# Patient Record
Sex: Female | Born: 1950 | Race: White | Hispanic: No | Marital: Married | Smoking: Former smoker
Health system: Southern US, Community
[De-identification: ages and names within clinical notes are randomized; demographics above are authoritative.]

## PROBLEM LIST (undated history)

## (undated) DIAGNOSIS — F419 Anxiety disorder, unspecified: Secondary | ICD-10-CM

## (undated) DIAGNOSIS — F32A Depression, unspecified: Secondary | ICD-10-CM

## (undated) DIAGNOSIS — E079 Disorder of thyroid, unspecified: Secondary | ICD-10-CM

## (undated) DIAGNOSIS — E785 Hyperlipidemia, unspecified: Secondary | ICD-10-CM

## (undated) DIAGNOSIS — I2510 Atherosclerotic heart disease of native coronary artery without angina pectoris: Secondary | ICD-10-CM

## (undated) DIAGNOSIS — I10 Essential (primary) hypertension: Secondary | ICD-10-CM

## (undated) HISTORY — DX: Anxiety disorder, unspecified: F41.9

## (undated) HISTORY — DX: Hyperlipidemia, unspecified: E78.5

## (undated) HISTORY — DX: Depression, unspecified: F32.A

## (undated) HISTORY — DX: Essential (primary) hypertension: I10

## (undated) HISTORY — PX: DILATION AND CURETTAGE OF UTERUS: SHX78

## (undated) HISTORY — DX: Disorder of thyroid, unspecified: E07.9

## (undated) HISTORY — DX: Atherosclerotic heart disease of native coronary artery without angina pectoris: I25.10

## (undated) HISTORY — PX: COLONOSCOPY: SHX174

---

## 1998-01-26 ENCOUNTER — Other Ambulatory Visit: Admission: RE | Admit: 1998-01-26 | Discharge: 1998-01-26 | Payer: Self-pay | Admitting: Obstetrics and Gynecology

## 1998-10-07 ENCOUNTER — Emergency Department (HOSPITAL_COMMUNITY): Admission: EM | Admit: 1998-10-07 | Discharge: 1998-10-07 | Payer: Self-pay | Admitting: Emergency Medicine

## 1998-10-08 ENCOUNTER — Encounter (HOSPITAL_COMMUNITY): Admission: RE | Admit: 1998-10-08 | Discharge: 1999-01-06 | Payer: Self-pay

## 2002-03-03 ENCOUNTER — Other Ambulatory Visit: Admission: RE | Admit: 2002-03-03 | Discharge: 2002-03-03 | Payer: Self-pay | Admitting: Obstetrics and Gynecology

## 2003-03-09 ENCOUNTER — Other Ambulatory Visit: Admission: RE | Admit: 2003-03-09 | Discharge: 2003-03-09 | Payer: Self-pay | Admitting: Obstetrics and Gynecology

## 2005-05-07 ENCOUNTER — Other Ambulatory Visit: Admission: RE | Admit: 2005-05-07 | Discharge: 2005-05-07 | Payer: Self-pay | Admitting: Obstetrics and Gynecology

## 2005-05-08 ENCOUNTER — Encounter: Admission: RE | Admit: 2005-05-08 | Discharge: 2005-05-08 | Payer: Self-pay | Admitting: Gastroenterology

## 2005-07-23 ENCOUNTER — Encounter (INDEPENDENT_AMBULATORY_CARE_PROVIDER_SITE_OTHER): Payer: Self-pay | Admitting: Specialist

## 2005-07-23 ENCOUNTER — Ambulatory Visit (HOSPITAL_COMMUNITY): Admission: RE | Admit: 2005-07-23 | Discharge: 2005-07-23 | Payer: Self-pay | Admitting: Obstetrics and Gynecology

## 2007-06-29 ENCOUNTER — Encounter: Admission: RE | Admit: 2007-06-29 | Discharge: 2007-06-29 | Payer: Self-pay | Admitting: Obstetrics and Gynecology

## 2009-03-15 ENCOUNTER — Emergency Department (HOSPITAL_COMMUNITY): Admission: EM | Admit: 2009-03-15 | Discharge: 2009-03-15 | Payer: Self-pay | Admitting: Emergency Medicine

## 2009-03-22 ENCOUNTER — Encounter: Admission: RE | Admit: 2009-03-22 | Discharge: 2009-03-22 | Payer: Self-pay | Admitting: Orthopedic Surgery

## 2009-04-02 ENCOUNTER — Ambulatory Visit (HOSPITAL_COMMUNITY): Admission: RE | Admit: 2009-04-02 | Discharge: 2009-04-02 | Payer: Self-pay | Admitting: Internal Medicine

## 2009-04-03 ENCOUNTER — Encounter: Admission: RE | Admit: 2009-04-03 | Discharge: 2009-04-03 | Payer: Self-pay | Admitting: Internal Medicine

## 2009-04-23 ENCOUNTER — Encounter: Admission: RE | Admit: 2009-04-23 | Discharge: 2009-04-23 | Payer: Self-pay | Admitting: Orthopedic Surgery

## 2009-04-24 ENCOUNTER — Ambulatory Visit (HOSPITAL_COMMUNITY): Admission: RE | Admit: 2009-04-24 | Discharge: 2009-04-24 | Payer: Self-pay | Admitting: Orthopedic Surgery

## 2010-03-06 ENCOUNTER — Emergency Department (HOSPITAL_COMMUNITY): Admission: EM | Admit: 2010-03-06 | Discharge: 2010-03-07 | Payer: Self-pay | Admitting: Emergency Medicine

## 2010-03-07 ENCOUNTER — Inpatient Hospital Stay (HOSPITAL_COMMUNITY): Admission: AD | Admit: 2010-03-07 | Discharge: 2010-03-25 | Payer: Self-pay | Admitting: Psychiatry

## 2010-03-07 ENCOUNTER — Ambulatory Visit: Payer: Self-pay | Admitting: Psychiatry

## 2010-07-21 ENCOUNTER — Encounter: Payer: Self-pay | Admitting: Obstetrics and Gynecology

## 2010-09-12 LAB — RAPID URINE DRUG SCREEN, HOSP PERFORMED
Amphetamines: NOT DETECTED
Barbiturates: NOT DETECTED
Benzodiazepines: NOT DETECTED
Cocaine: NOT DETECTED
Opiates: NOT DETECTED
Tetrahydrocannabinol: NOT DETECTED

## 2010-09-12 LAB — CBC
HCT: 45.4 % (ref 36.0–46.0)
Hemoglobin: 15.8 g/dL — ABNORMAL HIGH (ref 12.0–15.0)
MCH: 33.1 pg (ref 26.0–34.0)
MCHC: 34.8 g/dL (ref 30.0–36.0)
MCV: 95.1 fL (ref 78.0–100.0)
Platelets: 429 10*3/uL — ABNORMAL HIGH (ref 150–400)
RBC: 4.78 MIL/uL (ref 3.87–5.11)
RDW: 12.6 % (ref 11.5–15.5)
WBC: 12.1 10*3/uL — ABNORMAL HIGH (ref 4.0–10.5)

## 2010-09-12 LAB — COMPREHENSIVE METABOLIC PANEL
ALT: 17 U/L (ref 0–35)
AST: 28 U/L (ref 0–37)
Albumin: 4.7 g/dL (ref 3.5–5.2)
Alkaline Phosphatase: 51 U/L (ref 39–117)
BUN: 15 mg/dL (ref 6–23)
CO2: 23 mEq/L (ref 19–32)
Calcium: 9.6 mg/dL (ref 8.4–10.5)
Chloride: 107 mEq/L (ref 96–112)
Creatinine, Ser: 1.01 mg/dL (ref 0.4–1.2)
GFR calc Af Amer: >60 mL/min (ref >60)
GFR calc non Af Amer: 56 mL/min — ABNORMAL LOW (ref >60)
Glucose, Bld: 102 mg/dL — ABNORMAL HIGH (ref 70–99)
Potassium: 4.6 mEq/L (ref 3.5–5.1)
Sodium: 141 mEq/L (ref 135–145)
Total Bilirubin: 1.1 mg/dL (ref 0.3–1.2)
Total Protein: 7.9 g/dL (ref 6.0–8.3)

## 2010-09-12 LAB — DIFFERENTIAL
Basophils Absolute: 0 10*3/uL (ref 0.0–0.1)
Basophils Relative: 0 % (ref 0–1)
Eosinophils Absolute: 0.1 10*3/uL (ref 0.0–0.7)
Eosinophils Relative: 1 % (ref 0–5)
Lymphocytes Relative: 20 % (ref 12–46)
Lymphs Abs: 2.4 10*3/uL (ref 0.7–4.0)
Monocytes Absolute: 0.7 10*3/uL (ref 0.1–1.0)
Monocytes Relative: 6 % (ref 3–12)
Neutro Abs: 8.9 10*3/uL — ABNORMAL HIGH (ref 1.7–7.7)
Neutrophils Relative %: 73 % (ref 43–77)

## 2010-09-12 LAB — T3, FREE: T3, Free: 2.4 pg/mL (ref 2.3–4.2)

## 2010-09-12 LAB — TSH: TSH: 2.413 u[IU]/mL (ref 0.350–4.500)

## 2010-09-12 LAB — TRICYCLICS SCREEN, URINE: TCA Scrn: NOT DETECTED

## 2010-09-12 LAB — ETHANOL: Alcohol, Ethyl (B): <5 mg/dL (ref 0–10)

## 2010-09-12 LAB — T4, FREE: Free T4: 1.28 ng/dL (ref 0.80–1.80)

## 2010-10-03 LAB — BASIC METABOLIC PANEL
BUN: 12 mg/dL (ref 6–23)
CO2: 30 mEq/L (ref 19–32)
Calcium: 8.9 mg/dL (ref 8.4–10.5)
Chloride: 97 mEq/L (ref 96–112)
Creatinine, Ser: 0.87 mg/dL (ref 0.4–1.2)
GFR calc Af Amer: >60 mL/min (ref >60)
GFR calc non Af Amer: >60 mL/min (ref >60)
Glucose, Bld: 75 mg/dL (ref 70–99)
Potassium: 3.6 mEq/L (ref 3.5–5.1)
Sodium: 134 mEq/L — ABNORMAL LOW (ref 135–145)

## 2010-10-03 LAB — ACTH STIMULATION, 3 TIME POINTS
Cortisol, 30 Min: 11.2 ug/dL (ref >20)
Cortisol, 60 Min: 14.3 ug/dL (ref >20)
Cortisol, Base: 3.7 ug/dL

## 2010-11-15 NOTE — Op Note (Signed)
[name], Veronica Wood               ACCOUNT NO.:  [id number]   MEDICAL RECORD NO.:  [id number]          PATIENT TYPE:  AMB   LOCATION:  SDC                           FACILITY:  [facility]   PHYSICIAN:  Juluis Mire, M.D.   DATE OF BIRTH:  05/16/1951   DATE OF PROCEDURE:  07/23/2005  DATE OF DISCHARGE:                                 OPERATIVE REPORT   PREOPERATIVE DIAGNOSIS:  Endometrial polyp.   POSTOPERATIVE DIAGNOSIS:  Endometrial polyp.   OPERATION/PROCEDURE:  1.  Hysteroscopy.  2.  Resection of polyp.  3.  Multiple endometrial biopsies.  4.  Endometrial curettings.   SURGEON:  Juluis Mire, M.D.   ANESTHESIA:  General.   ESTIMATED BLOOD LOSS:  Minimal.   PACKS AND DRAINS:  None.   INTRAOPERATIVE BLOOD REPLACED:  None.   COMPLICATIONS:  None.   INDICATIONS:  Dictated in history and physical.   DESCRIPTION OF PROCEDURE:  The patient was taken to the OR, placed in the  supine position.  After satisfactory level of general anesthesia was  obtained, the patient was placed in the dorsal lithotomy position using the  Allen stirrups.  Perineum and vagina were cleaned out with Betadine.  The  patient draped in a sterile field.  Speculum was placed in the vaginal  vault.  The uterus was sounded to 8 cm.  Cervix was serially dilated to a  size 33 Pratt dilator.  Operative hysteroscope was introduced and  intrauterine cavity was distended using sorbitol.  Visualization was  revealed a polyp-like outgrowth from the left fundal area.  This was  resected along with multiple endometrial biopsies.  Total deficit was 40 mL.  There was no active bleeding or signs of perforation.  We did  endometrial curettings.  Single-tooth tenaculum and speculum removed.  The  patient was taken out of the dorsal lithotomy position and once alert and  extubated was transferred to the recovery room in good condition.  Sponge,  instrument and needle counts reported as correct by the circulating  nurse.      Juluis Mire, M.D.  Electronically Signed     JSM/MEDQ  D:  07/23/2005  T:  07/23/2005  Job:  [id number]

## 2010-11-15 NOTE — H&P (Signed)
Veronica Wood, LUMSDEN               ACCOUNT NO.:  [id number]   MEDICAL RECORD NO.:  [id number]           PATIENT TYPE:   LOCATION:                                FACILITY:  [facility]   PHYSICIAN:  Juluis Mire, M.D.   DATE OF BIRTH:  [birth year]   DATE OF ADMISSION:  07/23/2005  DATE OF DISCHARGE:                                HISTORY & PHYSICAL   Patient is a 60 year old gravida 5, para 0, abortus 5 female who presents  for hysteroscopy.   In relation to the present admission patient has had a history of abnormal  uterine bleeding.  She has had normal FSH levels, therefore is not  menopausal.  Underwent a saline infusion ultrasound, had a prominent  endometrial polyp and small fibroids.  Therefore, proceeds with  hysteroscopic resection of the polyp.   ALLERGIES:  No known drug allergies.   MEDICATIONS:  She is on Prozac and Xanax as needed.   PAST MEDICAL HISTORY:  Significant for history of recurrent pregnancy  losses.  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   PAST SURGICAL HISTORY:  She has had previous diagnostic laparoscopies.  She  has also had five miscarriages.   SOCIAL HISTORY:  There is no tobacco or alcohol use.   FAMILY HISTORY:  Noncontributory.   REVIEW OF SYSTEMS:  Noncontributory.   PHYSICAL EXAMINATION:  VITAL SIGNS:  Patient is afebrile with stable vital  signs.  HEENT:  Patient normocephalic.  Pupils are equal, round, and reactive to  light and accommodation.  Extraocular movements were intact.  Sclerae and  conjunctivae were clear.  Oropharynx clear.  NECK:  Without thyromegaly.  BREASTS:  No discrete masses noted at the present time.  LUNGS:  Clear.  CARDIOVASCULAR:  Regular rhythm and rate without murmurs or gallops.  ABDOMEN:  Benign.  No mass, organomegaly, or tenderness.  PELVIC:  Normal external genitalia.  Vaginal mucosa clear.  Cervix  unremarkable.  Uterus normal size, shape, and contour.  Adnexa free of mass  or  tenderness.  EXTREMITIES:  Trace edema.  NEUROLOGIC:  Grossly within normal limits.   IMPRESSION:  1.  Abnormal bleeding with evidence of endometrial polyp.  2.  Mitral valve prolapse.   PLAN:  Will do SBE prophylaxis.  Proceed with hysteroscopic evaluation and  resection of polyp.  The risks of surgery have been discussed including the  risk of infection, the risk of hemorrhage that could require transfusion  with the risk of AIDS or hepatitis or possible hysterectomy, the risk of  perforation that could lead to injury to adjacent organs requiring  exploratory surgery, risk of deep venous thrombosis and pulmonary embolus.  Patient expressed understanding of indications and risks.      Juluis Mire, M.D.  Electronically Signed     JSM/MEDQ  D:  07/22/2005  T:  07/22/2005  Job:  [id number]

## 2013-07-05 ENCOUNTER — Other Ambulatory Visit: Payer: Self-pay | Admitting: Internal Medicine

## 2013-07-05 DIAGNOSIS — R29898 Other symptoms and signs involving the musculoskeletal system: Secondary | ICD-10-CM

## 2013-07-06 ENCOUNTER — Ambulatory Visit
Admission: RE | Admit: 2013-07-06 | Discharge: 2013-07-06 | Disposition: A | Discharge status: Home / Self Care | Payer: BC Managed Care – PPO | Source: Ambulatory Visit | Attending: Internal Medicine | Admitting: Internal Medicine

## 2013-07-06 DIAGNOSIS — R29898 Other symptoms and signs involving the musculoskeletal system: Secondary | ICD-10-CM

## 2013-07-07 ENCOUNTER — Other Ambulatory Visit: Payer: Self-pay | Admitting: Internal Medicine

## 2013-07-07 DIAGNOSIS — M5412 Radiculopathy, cervical region: Secondary | ICD-10-CM

## 2013-07-09 ENCOUNTER — Ambulatory Visit
Admission: RE | Admit: 2013-07-09 | Discharge: 2013-07-09 | Disposition: A | Discharge status: Home / Self Care | Payer: BC Managed Care – PPO | Source: Ambulatory Visit | Attending: Internal Medicine | Admitting: Internal Medicine

## 2013-07-09 DIAGNOSIS — M5412 Radiculopathy, cervical region: Secondary | ICD-10-CM

## 2015-02-27 ENCOUNTER — Other Ambulatory Visit: Payer: Self-pay | Admitting: Obstetrics and Gynecology

## 2015-03-01 LAB — CYTOLOGY - PAP

## 2016-03-27 ENCOUNTER — Emergency Department (HOSPITAL_COMMUNITY): Payer: BLUE CROSS/BLUE SHIELD

## 2016-03-27 ENCOUNTER — Encounter (HOSPITAL_COMMUNITY): Payer: Self-pay | Admitting: Emergency Medicine

## 2016-03-27 ENCOUNTER — Emergency Department (HOSPITAL_COMMUNITY)
Admission: EM | Admit: 2016-03-27 | Discharge: 2016-03-27 | Disposition: A | Discharge status: Home / Self Care | Payer: BLUE CROSS/BLUE SHIELD | Attending: Emergency Medicine | Admitting: Emergency Medicine

## 2016-03-27 DIAGNOSIS — S40211A Abrasion of right shoulder, initial encounter: Secondary | ICD-10-CM | POA: Insufficient documentation

## 2016-03-27 DIAGNOSIS — Y929 Unspecified place or not applicable: Secondary | ICD-10-CM | POA: Insufficient documentation

## 2016-03-27 DIAGNOSIS — Y939 Activity, unspecified: Secondary | ICD-10-CM | POA: Diagnosis not present

## 2016-03-27 DIAGNOSIS — S0181XA Laceration without foreign body of other part of head, initial encounter: Secondary | ICD-10-CM

## 2016-03-27 DIAGNOSIS — Y999 Unspecified external cause status: Secondary | ICD-10-CM | POA: Diagnosis not present

## 2016-03-27 DIAGNOSIS — S42001A Fracture of unspecified part of right clavicle, initial encounter for closed fracture: Secondary | ICD-10-CM | POA: Diagnosis not present

## 2016-03-27 DIAGNOSIS — S93402A Sprain of unspecified ligament of left ankle, initial encounter: Secondary | ICD-10-CM | POA: Insufficient documentation

## 2016-03-27 DIAGNOSIS — W19XXXA Unspecified fall, initial encounter: Secondary | ICD-10-CM | POA: Diagnosis not present

## 2016-03-27 DIAGNOSIS — S4991XA Unspecified injury of right shoulder and upper arm, initial encounter: Secondary | ICD-10-CM | POA: Diagnosis present

## 2016-03-27 MED ORDER — OXYCODONE-ACETAMINOPHEN 5-325 MG PO TABS: 1.0000 | ORAL_TABLET | ORAL | 0 refills | Status: DC | PRN

## 2016-03-27 MED ORDER — IBUPROFEN 200 MG PO TABS
600.0000 mg | ORAL_TABLET | Freq: Once | ORAL | Status: AC
  Administered 2016-03-27: 600 mg via ORAL
  Filled 2016-03-27: qty 3

## 2016-03-27 MED ORDER — OXYCODONE-ACETAMINOPHEN 5-325 MG PO TABS
2.0000 | ORAL_TABLET | Freq: Once | ORAL | Status: AC
  Administered 2016-03-27: 2 via ORAL
  Filled 2016-03-27: qty 2

## 2016-03-27 MED ORDER — LIDOCAINE-EPINEPHRINE (PF) 1 %-1:200000 IJ SOLN
10.0000 mL | Freq: Once | INTRAMUSCULAR | Status: AC
  Administered 2016-03-27: 10 mL
  Filled 2016-03-27: qty 30

## 2016-03-27 NOTE — ED Provider Notes (Signed)
[facility]-EMERGENCY DEPT Provider Note   CSN: [id number] Arrival date & time: 03/27/16  1126     History   Chief Complaint Chief Complaint  Patient presents with  . Head Injury  . Shoulder Pain  . Ankle Pain    HPI Veronica Wood is a 65 y.o. female.  HPI   65yF presenting after fall. Lost balance and fell. Struck head. No LOC. Facial laceration, L ankle pain and L shoulder pain. Can bear weight. No numbness or tingling. No n/v. No confusion. Broke glasses but denies acute visual changes. No blood thinners. Tetanus UTD.   No past medical history on file.  There are no active problems to display for this patient.   No past surgical history on file.  OB History    No data available       Home Medications    Prior to Admission medications   Medication Sig Start Date End Date Taking? Authorizing Provider  b complex vitamins tablet Take 1 tablet by mouth daily.   Yes Historical Provider, MD  Calcium 250 MG CAPS Take 500 mg by mouth 2 (two) times daily.   Yes Historical Provider, MD  DULoxetine (CYMBALTA) 60 MG capsule Take 60 mg by mouth daily. 01/21/16  Yes Historical Provider, MD  lamoTRIgine (LAMICTAL) 200 MG tablet Take 200 mg by mouth daily. 03/18/16  Yes Historical Provider, MD  levothyroxine (SYNTHROID, LEVOTHROID) 100 MCG tablet Take 50 mcg by mouth every morning.  02/20/16  Yes Historical Provider, MD  MAGNESIUM PO Take 1 tablet by mouth daily.   Yes Historical Provider, MD  Omega-3 Fatty Acids (FISH OIL) 1000 MG CAPS Take 3,000 mg by mouth.   Yes Historical Provider, MD  QUEtiapine (SEROQUEL) 200 MG tablet Take 400-600 mg by mouth at bedtime. 02/20/16  Yes Historical Provider, MD  oxyCODONE-acetaminophen (PERCOCET/ROXICET) 5-325 MG tablet Take 1-2 tablets by mouth every 4 (four) hours as needed for severe pain. 03/27/16   Raeford Razor, MD    Family History No family history on file.  Social History Social History  Substance Use Topics  . Smoking status: Never  Smoker  . Smokeless tobacco: Never Used  . Alcohol use Not on file     Allergies   Celexa [citalopram hydrobromide]   Review of Systems Review of Systems   All systems reviewed and negative, other than as noted in HPI.    Physical Exam Updated Vital Signs BP 183/93 (BP Location: Left Arm)   Pulse 78   Temp 98.4 F (36.9 C)   Resp 18   Ht 5\' 6"  (1.676 m)   Wt 155 lb (70.3 kg)   SpO2 92%   BMI 25.02 kg/m   Physical Exam  Constitutional: She is oriented to person, place, and time. She appears well-developed and well-nourished. No distress.  HENT:  Head: Normocephalic and atraumatic.    Right Ear: External ear normal.  Left Ear: External ear normal.  Mouth/Throat: Oropharynx is clear and moist.  8cm laceration in depicted area. No active bleeding. Metallic FB noted and removed. Mild R periorbital ecchymosis. Minimal bony tenderness. EOM normal.   Eyes: Conjunctivae and EOM are normal. Pupils are equal, round, and reactive to light. Right eye exhibits no discharge. Left eye exhibits no discharge.  Neck: Normal range of motion. Neck supple.  Cardiovascular: Normal rate, regular rhythm and normal heart sounds.  Exam reveals no gallop and no friction rub.   No murmur heard. Pulmonary/Chest: Effort normal and breath sounds normal. No respiratory distress.  Abdominal: Soft. She exhibits no distension. There is no tenderness.  Musculoskeletal: She exhibits no edema or tenderness.  TTP around R AC joint. Abrasion noted, but no open wounds. Significantly limited ROM of shoulder 2/2 pain. Appears to be NVI though. Mild TTP lateral malleolus L ankle. No swelling. NVI. No midline spinal tenderness.   Neurological: She is alert and oriented to person, place, and time. She displays normal reflexes. No cranial nerve deficit. She exhibits normal muscle tone.  Skin: Skin is warm and dry.  Psychiatric: She has a normal mood and affect. Her behavior is normal. Thought content normal.    Nursing note and vitals reviewed.    ED Treatments / Results  Labs (all labs ordered are listed, but only abnormal results are displayed) Labs Reviewed - No data to display  EKG  EKG Interpretation None       Radiology Dg Shoulder Right  Result Date: 03/27/2016 CLINICAL DATA:  Right shoulder pain after a fall today. EXAM: RIGHT SHOULDER - 2+ VIEW COMPARISON:  None. FINDINGS: There is an acute, closed, displaced fracture involving the distal clavicle with slight caudal angulation of the distal fracture fragment and cephalad displacement of the fragment by 1 shaft width. It does not appear to extend into the acromioclavicular joint. The coracoclavicular space does not appear abnormally widened. The glenohumeral joint is maintained. The adjacent visualized ribs and lung are unremarkable. IMPRESSION: Acute, closed, caudal angulated and cephalad displaced distal right clavicular fracture. Electronically Signed   By: Tollie Ethavid  Kwon M.D.   On: 03/27/2016 13:20   Dg Ankle Complete Left  Result Date: 03/27/2016 CLINICAL DATA:  Acute lateral malleolar ankle pain after fall today EXAM: LEFT ANKLE COMPLETE - 3+ VIEW COMPARISON:  None. FINDINGS: There is no evidence of fracture, dislocation, or joint effusion. There is no evidence of arthropathy or other focal bone abnormality. Tiny ossification is seen posterior to the calcaneus along the course of the distal Achilles. The base of fifth metatarsal appears intact. IMPRESSION: Negative for acute fracture or malalignment. Tiny ossific density along the course of the distal Achilles possibly related to old remote trauma or tendinopathy. Electronically Signed   By: Tollie Ethavid  Kwon M.D.   On: 03/27/2016 13:22    Procedures Procedures (including critical care time)  LACERATION REPAIR Performed by: Raeford RazorKOHUT, Livio Ledwith Authorized by: Raeford RazorKOHUT, Thadius Smisek Consent: Verbal consent obtained. Risks and benefits: risks, benefits and alternatives were discussed Consent given  by: patient Patient identity confirmed: provided demographic data Prepped and Draped in normal sterile fashion Wound explored  Laceration Location: face  Laceration Length: 8 cm  Metallic FB located and removed. Appeared to be part of hinge from eye glasses. No additional pieces or other FB noted.   Anesthesia: local infiltration  Local anesthetic: lidocaine 1% w/ epinephrine  Anesthetic total: 4 ml  Irrigation method: syringe Amount of cleaning: standard  Skin closure: 6-0 prolene, 5-0 vicryl rapide  Number of sutures: 8 simple interrupted, 1 running, 1 subcut  Technique: running, simple interupted  Patient tolerance: Patient tolerated the procedure well with no immediate complications.   Medications Ordered in ED Medications  lidocaine-EPINEPHrine (XYLOCAINE-EPINEPHrine) 1 %-1:200000 (PF) injection 10 mL (10 mLs Infiltration Given 03/27/16 1647)  oxyCODONE-acetaminophen (PERCOCET/ROXICET) 5-325 MG per tablet 2 tablet (2 tablets Oral Given 03/27/16 1644)  ibuprofen (ADVIL,MOTRIN) tablet 600 mg (600 mg Oral Given 03/27/16 1644)     Initial Impression / Assessment and Plan / ED Course  I have reviewed the triage vital signs and the nursing notes.  Pertinent labs & imaging results that were available during my care of the patient were reviewed by me and considered in my medical decision making (see chart for details).  Clinical Course    65yF s/p mechanical fall. Closed R clavicle fx. Sling and PRN pain meds. L ankle films without acute osseous abnormality. Did hit head, but neuro exam is nonfocal. No blood thinners. Complex facial laceration. Small metallic FB removed. It appeared to be part of the hinge from her eye glasses. Doubt facial fx. Laceration repaired. Tetanus current. Ortho FU and PCP for suture removal. Return precautions discussed.   Final Clinical Impressions(s) / ED Diagnoses   Final diagnoses:  Clavicle fracture, right, closed, initial encounter  Facial  laceration, initial encounter  Ankle sprain, left, initial encounter    New Prescriptions New Prescriptions   OXYCODONE-ACETAMINOPHEN (PERCOCET/ROXICET) 5-325 MG TABLET    Take 1-2 tablets by mouth every 4 (four) hours as needed for severe pain.     Raeford Razor, MD 04/01/16 1018

## 2016-03-27 NOTE — ED Triage Notes (Addendum)
Pt states she tripped while walking on her sidewalk today. Pt reports right head pain with laceration, left ankle pain, and right shoulder pain. Pt reports hitting her head but denies LOC, neck pain, back pain and dizziness. No blood thinners.

## 2016-03-27 NOTE — ED Notes (Signed)
Suture cart at bedside 

## 2016-11-08 IMAGING — CR DG ANKLE COMPLETE 3+V*L*
3 series · 3 of 3 positions shown · non-contrast
Comparison: None.

CLINICAL DATA: Acute lateral malleolar ankle pain after fall today

EXAM:
LEFT ANKLE COMPLETE - 3+ VIEW

[x ankle ap left]
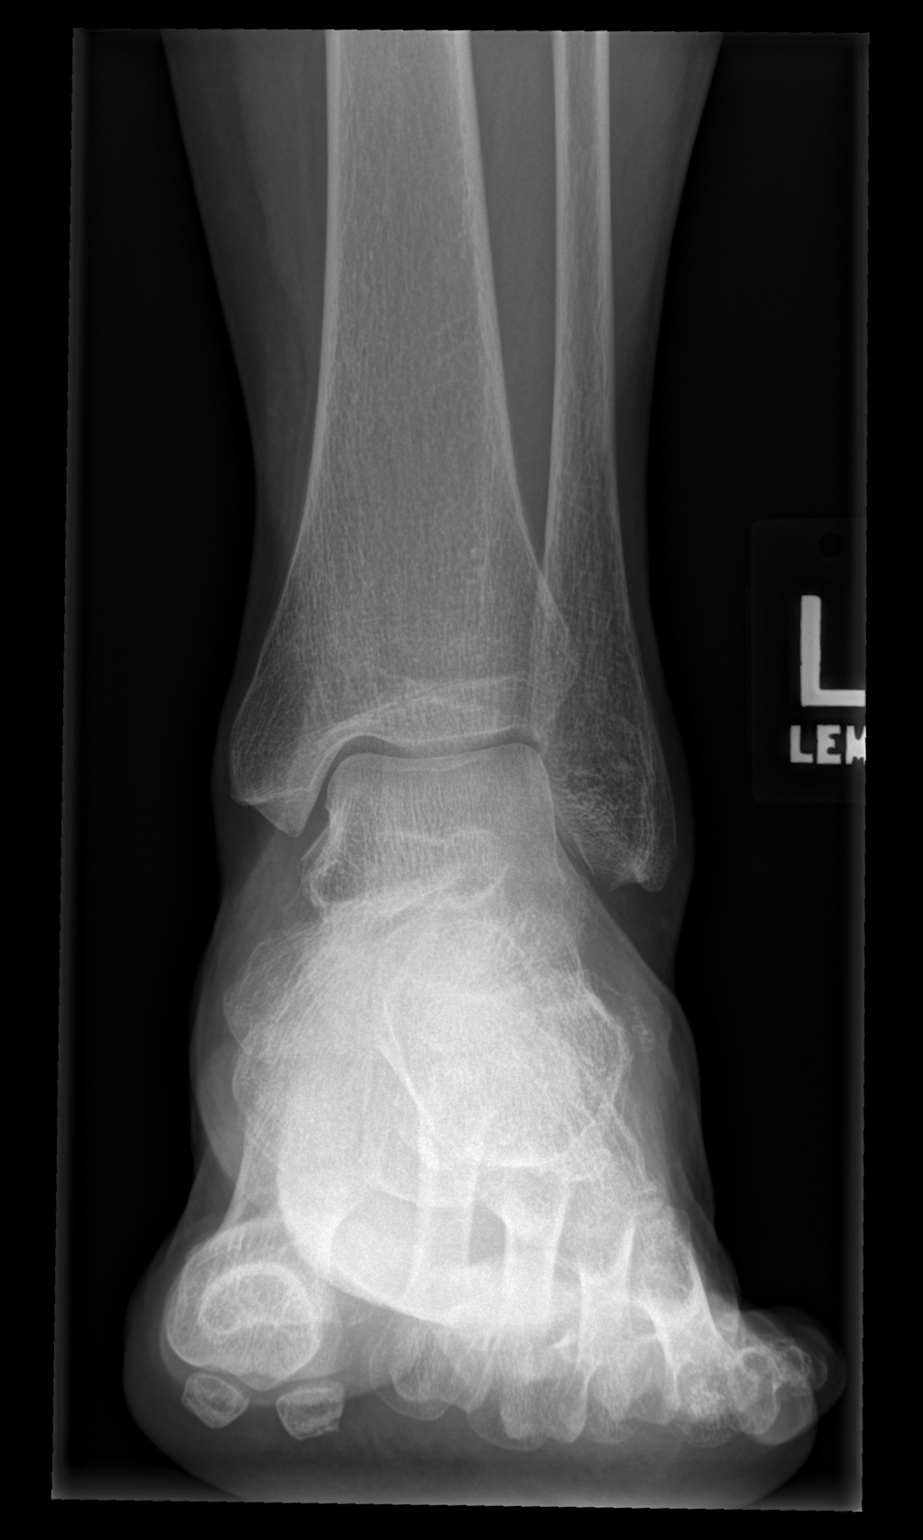

[x ankle obl left]
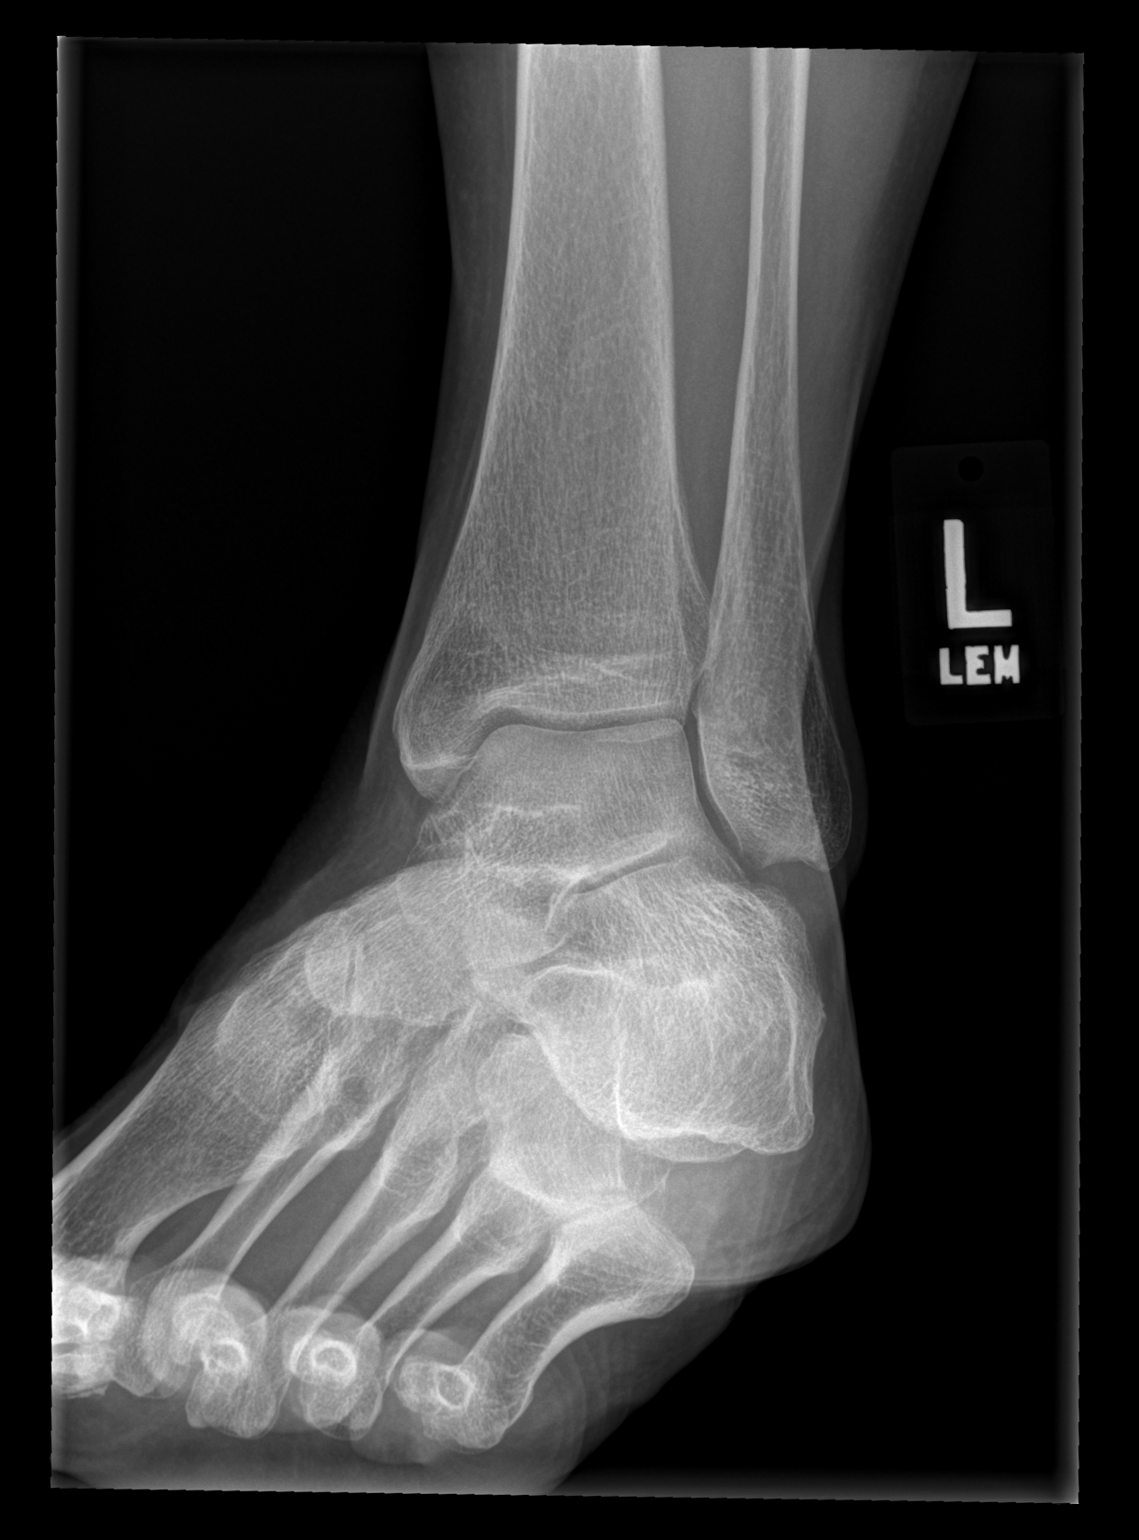

[x ankle lat left]
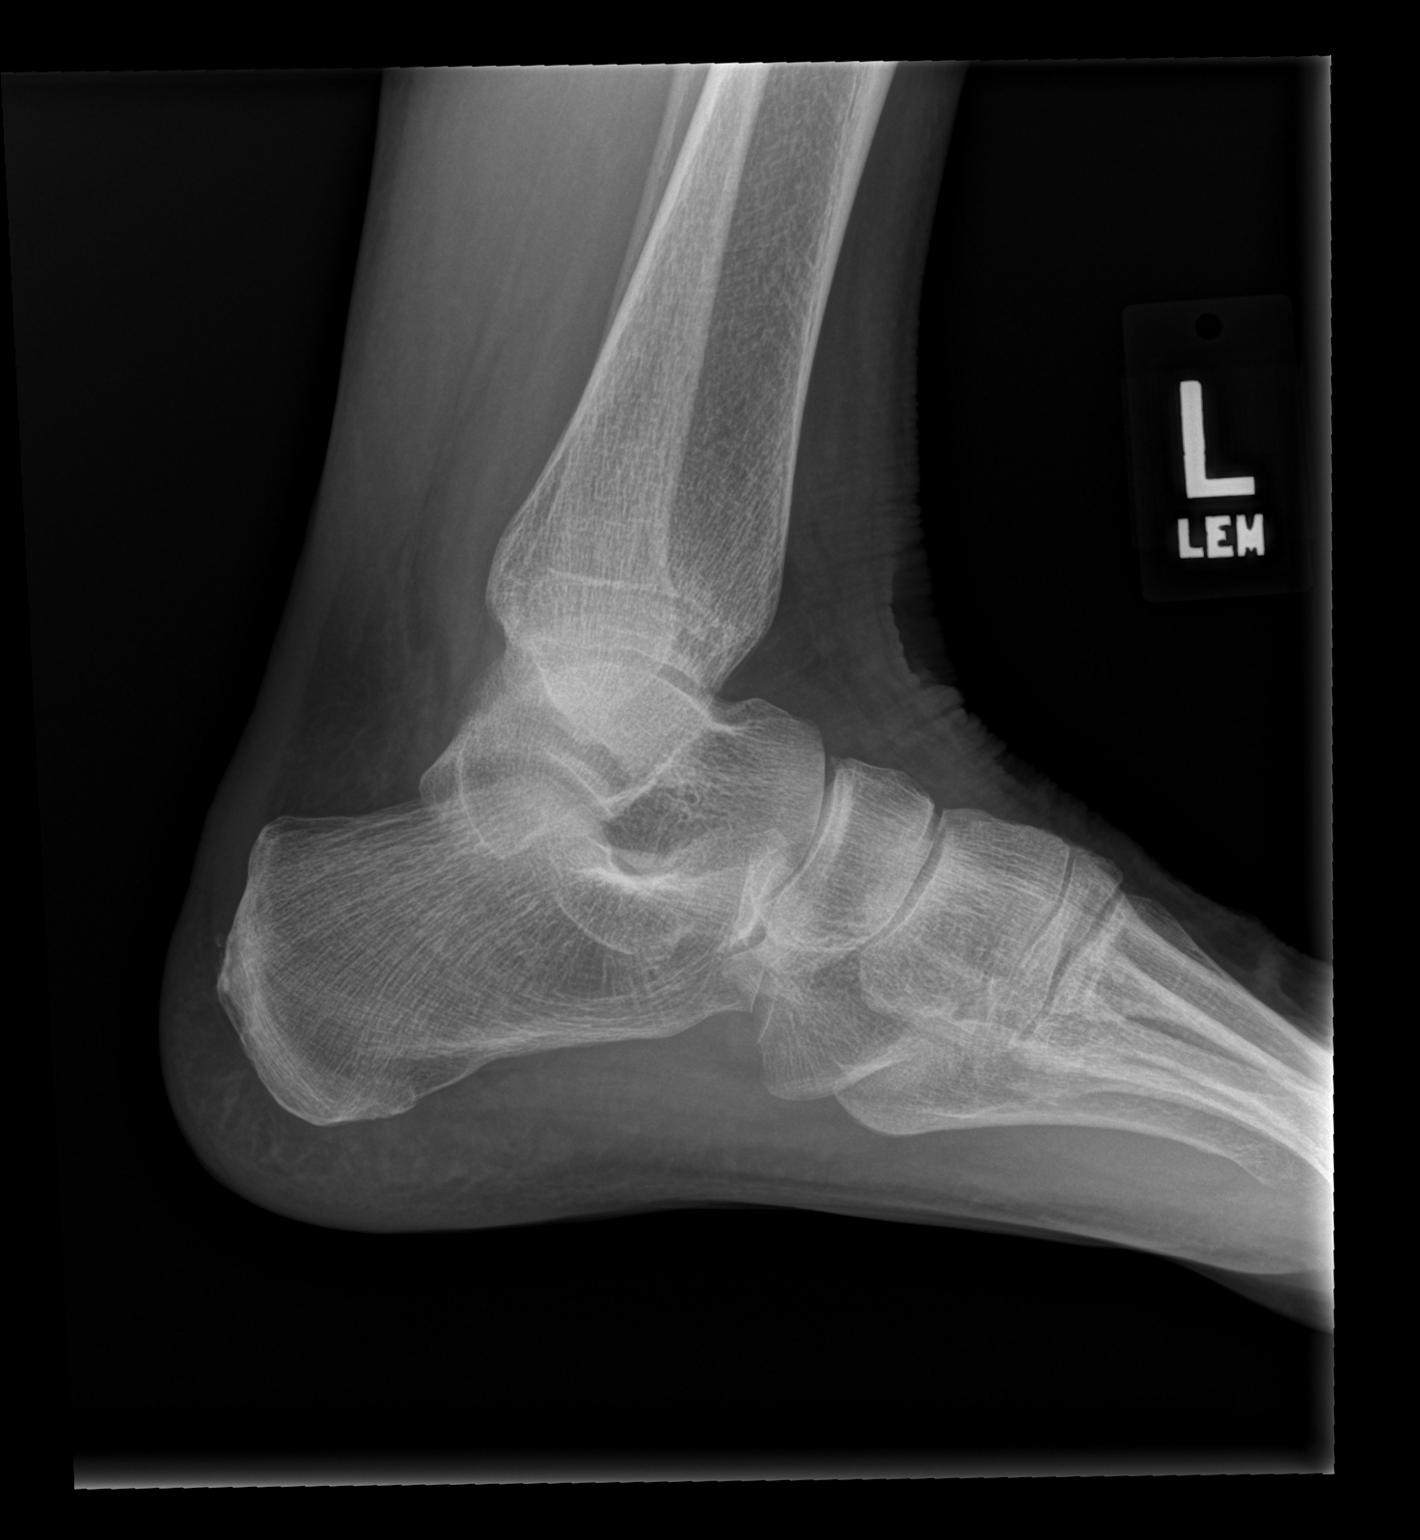

[3 of 3 positions shown; findings below may reference images not displayed]

FINDINGS: There is no evidence of fracture, dislocation, or joint effusion.
There is no evidence of arthropathy or other focal bone abnormality.
Tiny ossification is seen posterior to the calcaneus along the
course of the distal Achilles. The base of fifth metatarsal appears
intact.
IMPRESSION: Negative for acute fracture or malalignment. Tiny ossific density
along the course of the distal Achilles possibly related to old
remote trauma or tendinopathy.

## 2017-04-13 DIAGNOSIS — E559 Vitamin D deficiency, unspecified: Secondary | ICD-10-CM | POA: Diagnosis not present

## 2017-04-13 DIAGNOSIS — D72819 Decreased white blood cell count, unspecified: Secondary | ICD-10-CM | POA: Diagnosis not present

## 2017-04-13 DIAGNOSIS — E7800 Pure hypercholesterolemia, unspecified: Secondary | ICD-10-CM | POA: Diagnosis not present

## 2017-04-13 DIAGNOSIS — E039 Hypothyroidism, unspecified: Secondary | ICD-10-CM | POA: Diagnosis not present

## 2017-04-13 DIAGNOSIS — M8580 Other specified disorders of bone density and structure, unspecified site: Secondary | ICD-10-CM | POA: Diagnosis not present

## 2017-04-16 DIAGNOSIS — Z808 Family history of malignant neoplasm of other organs or systems: Secondary | ICD-10-CM | POA: Diagnosis not present

## 2017-04-16 DIAGNOSIS — M8580 Other specified disorders of bone density and structure, unspecified site: Secondary | ICD-10-CM | POA: Diagnosis not present

## 2017-04-16 DIAGNOSIS — K649 Unspecified hemorrhoids: Secondary | ICD-10-CM | POA: Diagnosis not present

## 2017-04-16 DIAGNOSIS — Z23 Encounter for immunization: Secondary | ICD-10-CM | POA: Diagnosis not present

## 2017-04-16 DIAGNOSIS — K5900 Constipation, unspecified: Secondary | ICD-10-CM | POA: Diagnosis not present

## 2017-04-16 DIAGNOSIS — I341 Nonrheumatic mitral (valve) prolapse: Secondary | ICD-10-CM | POA: Diagnosis not present

## 2017-04-16 DIAGNOSIS — L700 Acne vulgaris: Secondary | ICD-10-CM | POA: Diagnosis not present

## 2017-04-16 DIAGNOSIS — R0982 Postnasal drip: Secondary | ICD-10-CM | POA: Diagnosis not present

## 2017-04-16 DIAGNOSIS — M419 Scoliosis, unspecified: Secondary | ICD-10-CM | POA: Diagnosis not present

## 2017-04-16 DIAGNOSIS — F333 Major depressive disorder, recurrent, severe with psychotic symptoms: Secondary | ICD-10-CM | POA: Diagnosis not present

## 2017-04-16 DIAGNOSIS — E039 Hypothyroidism, unspecified: Secondary | ICD-10-CM | POA: Diagnosis not present

## 2017-04-16 DIAGNOSIS — M180 Bilateral primary osteoarthritis of first carpometacarpal joints: Secondary | ICD-10-CM | POA: Diagnosis not present

## 2017-08-06 DIAGNOSIS — H5203 Hypermetropia, bilateral: Secondary | ICD-10-CM | POA: Diagnosis not present

## 2017-08-06 DIAGNOSIS — H2513 Age-related nuclear cataract, bilateral: Secondary | ICD-10-CM | POA: Diagnosis not present

## 2017-12-17 DIAGNOSIS — Z1231 Encounter for screening mammogram for malignant neoplasm of breast: Secondary | ICD-10-CM | POA: Diagnosis not present

## 2017-12-17 DIAGNOSIS — Z124 Encounter for screening for malignant neoplasm of cervix: Secondary | ICD-10-CM | POA: Diagnosis not present

## 2017-12-17 DIAGNOSIS — Z6824 Body mass index (BMI) 24.0-24.9, adult: Secondary | ICD-10-CM | POA: Diagnosis not present

## 2018-02-22 DIAGNOSIS — Z86010 Personal history of colonic polyps: Secondary | ICD-10-CM | POA: Diagnosis not present

## 2018-05-14 DIAGNOSIS — M79672 Pain in left foot: Secondary | ICD-10-CM | POA: Diagnosis not present

## 2018-05-14 DIAGNOSIS — S92345A Nondisplaced fracture of fourth metatarsal bone, left foot, initial encounter for closed fracture: Secondary | ICD-10-CM | POA: Diagnosis not present

## 2018-05-14 DIAGNOSIS — S92355A Nondisplaced fracture of fifth metatarsal bone, left foot, initial encounter for closed fracture: Secondary | ICD-10-CM | POA: Diagnosis not present

## 2018-05-26 DIAGNOSIS — S92345A Nondisplaced fracture of fourth metatarsal bone, left foot, initial encounter for closed fracture: Secondary | ICD-10-CM | POA: Diagnosis not present

## 2018-05-26 DIAGNOSIS — S92355A Nondisplaced fracture of fifth metatarsal bone, left foot, initial encounter for closed fracture: Secondary | ICD-10-CM | POA: Diagnosis not present

## 2018-05-26 DIAGNOSIS — M79672 Pain in left foot: Secondary | ICD-10-CM | POA: Diagnosis not present

## 2018-06-04 DIAGNOSIS — D72819 Decreased white blood cell count, unspecified: Secondary | ICD-10-CM | POA: Diagnosis not present

## 2018-06-04 DIAGNOSIS — Z1159 Encounter for screening for other viral diseases: Secondary | ICD-10-CM | POA: Diagnosis not present

## 2018-06-04 DIAGNOSIS — E039 Hypothyroidism, unspecified: Secondary | ICD-10-CM | POA: Diagnosis not present

## 2018-06-08 DIAGNOSIS — M8580 Other specified disorders of bone density and structure, unspecified site: Secondary | ICD-10-CM | POA: Diagnosis not present

## 2018-06-08 DIAGNOSIS — F333 Major depressive disorder, recurrent, severe with psychotic symptoms: Secondary | ICD-10-CM | POA: Diagnosis not present

## 2018-06-08 DIAGNOSIS — Z23 Encounter for immunization: Secondary | ICD-10-CM | POA: Diagnosis not present

## 2018-06-08 DIAGNOSIS — Z0000 Encounter for general adult medical examination without abnormal findings: Secondary | ICD-10-CM | POA: Diagnosis not present

## 2018-06-08 DIAGNOSIS — E039 Hypothyroidism, unspecified: Secondary | ICD-10-CM | POA: Diagnosis not present

## 2018-06-08 DIAGNOSIS — E871 Hypo-osmolality and hyponatremia: Secondary | ICD-10-CM | POA: Diagnosis not present

## 2018-06-08 DIAGNOSIS — R251 Tremor, unspecified: Secondary | ICD-10-CM | POA: Diagnosis not present

## 2018-06-17 DIAGNOSIS — S92302A Fracture of unspecified metatarsal bone(s), left foot, initial encounter for closed fracture: Secondary | ICD-10-CM | POA: Diagnosis not present

## 2018-07-06 DIAGNOSIS — R251 Tremor, unspecified: Secondary | ICD-10-CM | POA: Diagnosis not present

## 2018-07-08 DIAGNOSIS — S92302A Fracture of unspecified metatarsal bone(s), left foot, initial encounter for closed fracture: Secondary | ICD-10-CM | POA: Diagnosis not present

## 2018-07-20 DIAGNOSIS — R030 Elevated blood-pressure reading, without diagnosis of hypertension: Secondary | ICD-10-CM | POA: Diagnosis not present

## 2018-07-20 DIAGNOSIS — R251 Tremor, unspecified: Secondary | ICD-10-CM | POA: Diagnosis not present

## 2018-08-06 DIAGNOSIS — H2513 Age-related nuclear cataract, bilateral: Secondary | ICD-10-CM | POA: Diagnosis not present

## 2018-08-10 DIAGNOSIS — R251 Tremor, unspecified: Secondary | ICD-10-CM | POA: Diagnosis not present

## 2018-08-10 DIAGNOSIS — I10 Essential (primary) hypertension: Secondary | ICD-10-CM | POA: Diagnosis not present

## 2018-08-12 DIAGNOSIS — M79672 Pain in left foot: Secondary | ICD-10-CM | POA: Diagnosis not present

## 2018-08-12 DIAGNOSIS — S92302A Fracture of unspecified metatarsal bone(s), left foot, initial encounter for closed fracture: Secondary | ICD-10-CM | POA: Diagnosis not present

## 2018-09-08 DIAGNOSIS — R251 Tremor, unspecified: Secondary | ICD-10-CM | POA: Diagnosis not present

## 2018-09-08 DIAGNOSIS — I10 Essential (primary) hypertension: Secondary | ICD-10-CM | POA: Diagnosis not present

## 2018-12-15 DIAGNOSIS — Z20828 Contact with and (suspected) exposure to other viral communicable diseases: Secondary | ICD-10-CM | POA: Diagnosis not present

## 2019-01-17 DIAGNOSIS — I10 Essential (primary) hypertension: Secondary | ICD-10-CM | POA: Diagnosis not present

## 2019-01-17 DIAGNOSIS — E039 Hypothyroidism, unspecified: Secondary | ICD-10-CM | POA: Diagnosis not present

## 2019-01-28 ENCOUNTER — Other Ambulatory Visit: Payer: Self-pay

## 2019-04-25 DIAGNOSIS — M25511 Pain in right shoulder: Secondary | ICD-10-CM | POA: Diagnosis not present

## 2019-07-14 DIAGNOSIS — Z6825 Body mass index (BMI) 25.0-25.9, adult: Secondary | ICD-10-CM | POA: Diagnosis not present

## 2019-07-14 DIAGNOSIS — Z01419 Encounter for gynecological examination (general) (routine) without abnormal findings: Secondary | ICD-10-CM | POA: Diagnosis not present

## 2019-07-14 DIAGNOSIS — M8588 Other specified disorders of bone density and structure, other site: Secondary | ICD-10-CM | POA: Diagnosis not present

## 2019-07-14 DIAGNOSIS — N958 Other specified menopausal and perimenopausal disorders: Secondary | ICD-10-CM | POA: Diagnosis not present

## 2019-07-14 DIAGNOSIS — Z1231 Encounter for screening mammogram for malignant neoplasm of breast: Secondary | ICD-10-CM | POA: Diagnosis not present

## 2019-07-19 DIAGNOSIS — E871 Hypo-osmolality and hyponatremia: Secondary | ICD-10-CM | POA: Diagnosis not present

## 2019-08-12 DIAGNOSIS — H5203 Hypermetropia, bilateral: Secondary | ICD-10-CM | POA: Diagnosis not present

## 2019-08-12 DIAGNOSIS — H524 Presbyopia: Secondary | ICD-10-CM | POA: Diagnosis not present

## 2019-08-12 DIAGNOSIS — H2513 Age-related nuclear cataract, bilateral: Secondary | ICD-10-CM | POA: Diagnosis not present

## 2020-01-24 DIAGNOSIS — H25811 Combined forms of age-related cataract, right eye: Secondary | ICD-10-CM | POA: Diagnosis not present

## 2020-01-24 DIAGNOSIS — H2511 Age-related nuclear cataract, right eye: Secondary | ICD-10-CM | POA: Diagnosis not present

## 2020-02-28 DIAGNOSIS — H25812 Combined forms of age-related cataract, left eye: Secondary | ICD-10-CM | POA: Diagnosis not present

## 2020-02-28 DIAGNOSIS — H2512 Age-related nuclear cataract, left eye: Secondary | ICD-10-CM | POA: Diagnosis not present

## 2020-03-04 DIAGNOSIS — Z23 Encounter for immunization: Secondary | ICD-10-CM | POA: Diagnosis not present

## 2020-04-23 DIAGNOSIS — Z23 Encounter for immunization: Secondary | ICD-10-CM | POA: Diagnosis not present

## 2020-06-18 DIAGNOSIS — E039 Hypothyroidism, unspecified: Secondary | ICD-10-CM | POA: Diagnosis not present

## 2020-06-18 DIAGNOSIS — M8580 Other specified disorders of bone density and structure, unspecified site: Secondary | ICD-10-CM | POA: Diagnosis not present

## 2020-06-18 DIAGNOSIS — I10 Essential (primary) hypertension: Secondary | ICD-10-CM | POA: Diagnosis not present

## 2020-06-18 DIAGNOSIS — E559 Vitamin D deficiency, unspecified: Secondary | ICD-10-CM | POA: Diagnosis not present

## 2020-06-21 DIAGNOSIS — M180 Bilateral primary osteoarthritis of first carpometacarpal joints: Secondary | ICD-10-CM | POA: Diagnosis not present

## 2020-06-21 DIAGNOSIS — F333 Major depressive disorder, recurrent, severe with psychotic symptoms: Secondary | ICD-10-CM | POA: Diagnosis not present

## 2020-06-21 DIAGNOSIS — E7800 Pure hypercholesterolemia, unspecified: Secondary | ICD-10-CM | POA: Diagnosis not present

## 2020-06-21 DIAGNOSIS — K5900 Constipation, unspecified: Secondary | ICD-10-CM | POA: Diagnosis not present

## 2020-06-21 DIAGNOSIS — Z0000 Encounter for general adult medical examination without abnormal findings: Secondary | ICD-10-CM | POA: Diagnosis not present

## 2020-06-21 DIAGNOSIS — M8580 Other specified disorders of bone density and structure, unspecified site: Secondary | ICD-10-CM | POA: Diagnosis not present

## 2020-06-21 DIAGNOSIS — E039 Hypothyroidism, unspecified: Secondary | ICD-10-CM | POA: Diagnosis not present

## 2020-06-21 DIAGNOSIS — I10 Essential (primary) hypertension: Secondary | ICD-10-CM | POA: Diagnosis not present

## 2020-07-27 ENCOUNTER — Encounter: Payer: Self-pay | Admitting: Cardiology

## 2020-07-27 ENCOUNTER — Ambulatory Visit: Payer: Medicare Other | Admitting: Cardiology

## 2020-07-27 ENCOUNTER — Other Ambulatory Visit: Payer: Self-pay

## 2020-07-27 VITALS — BP 139/84 | HR 70 | Temp 98.2°F | Ht 66.0 in | Wt 165.0 lb

## 2020-07-27 DIAGNOSIS — I10 Essential (primary) hypertension: Secondary | ICD-10-CM

## 2020-07-27 DIAGNOSIS — I2510 Atherosclerotic heart disease of native coronary artery without angina pectoris: Secondary | ICD-10-CM | POA: Diagnosis not present

## 2020-07-27 DIAGNOSIS — Z87891 Personal history of nicotine dependence: Secondary | ICD-10-CM

## 2020-07-27 DIAGNOSIS — E7800 Pure hypercholesterolemia, unspecified: Secondary | ICD-10-CM

## 2020-07-27 DIAGNOSIS — I2584 Coronary atherosclerosis due to calcified coronary lesion: Secondary | ICD-10-CM | POA: Diagnosis not present

## 2020-07-27 MED ORDER — ATORVASTATIN CALCIUM 20 MG PO TABS: 20.0000 mg | ORAL_TABLET | Freq: Every day | ORAL | 0 refills | Status: DC

## 2020-07-27 MED ORDER — ASPIRIN EC 81 MG PO TBEC: 81.0000 mg | DELAYED_RELEASE_TABLET | Freq: Every day | ORAL | 3 refills | Status: AC

## 2020-07-27 NOTE — Progress Notes (Signed)
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 New Patient (Initial Visit)    Coronary artery calcification    HPI  Veronica Wood is a 70 y.o. female who presents to the office with a chief complaint of " coronary calcification." Patient's past medical history and cardiovascular risk factors include: Hypertension, hypercholesterolemia, moderate coronary artery calcification, hypothyroidism, former smoker, advanced age, postmenopausal female.   She is referred to the office at the request of Deland Pretty, MD for evaluation of coronary disease.  Patient states that she recently had a visit with her primary care provider and was noted to have hyperlipidemia.  However, given her LDL being above normal limits this shared decision was to proceed with coronary artery calcium scoring for further stratification.  Patient was found to have moderate coronary artery calcification now referred to cardiology for further evaluation and management.  Clinically she denies any anginal chest pain or heart failure symptoms.   FUNCTIONAL STATUS: No structured exercise program or daily routine.   ALLERGIES: Allergies  Allergen Reactions  . Celexa [Citalopram Hydrobromide] Rash    MEDICATION LIST PRIOR TO VISIT: Current Meds  Medication Sig  . amLODipine (NORVASC) 5 MG tablet Take 5 mg by mouth daily.  Marland Kitchen aspirin EC 81 MG tablet Take 1 tablet (81 mg total) by mouth daily. Swallow whole.  Marland Kitchen atenolol (TENORMIN) 25 MG tablet Take 25 mg by mouth 2 (two) times daily.   Marland Kitchen atorvastatin (LIPITOR) 20 MG tablet Take 1 tablet (20 mg total) by mouth at bedtime.  . Calcium 250 MG CAPS Take 500 mg by mouth 2 (two) times daily.  Marland Kitchen docusate sodium (COLACE) 100 MG capsule 1 capsule as needed  . DULoxetine (CYMBALTA) 60 MG capsule Take 60 mg by mouth daily.  Marland Kitchen lamoTRIgine (LAMICTAL) 200 MG tablet Take 200 mg by mouth daily.  Marland Kitchen levothyroxine (SYNTHROID, LEVOTHROID) 100 MCG tablet Take 50 mcg by mouth every morning.   Marland Kitchen LORazepam (ATIVAN) 0.5 MG tablet Take 0.5 mg by mouth 2 (two) times daily as needed.  Marland Kitchen MAGNESIUM PO Take 1 tablet by mouth daily.  . QUEtiapine (SEROQUEL) 200 MG tablet Take 400-600 mg by mouth at bedtime.     PAST MEDICAL HISTORY: Past Medical History:  Diagnosis Date  . Anxiety   . Coronary atherosclerosis due to calcified coronary lesion   . Depression   . Hyperlipidemia   . Hypertension   . Thyroid disease     PAST SURGICAL HISTORY: Past Surgical History:  Procedure Laterality Date  . COLONOSCOPY    . DILATION AND CURETTAGE OF UTERUS      FAMILY HISTORY: The patient family history includes Pulmonary embolism in her father; Stroke in her mother.  SOCIAL HISTORY:  The patient  reports that she quit smoking about 35 years ago. Her smoking use included cigarettes. She has a 40.00 pack-year smoking history. She has never used smokeless tobacco. She reports current alcohol use of about 1.0 standard drink of alcohol per week. She reports that she does not use drugs.  REVIEW OF SYSTEMS: Review of Systems  Constitutional: Negative for chills and fever.  HENT: Negative for hoarse voice and nosebleeds.   Eyes: Negative for discharge, double vision and pain.  Cardiovascular: Negative for chest pain, claudication, dyspnea on exertion, leg swelling, near-syncope, orthopnea, palpitations, paroxysmal nocturnal dyspnea and syncope.  Respiratory: Negative for hemoptysis and shortness of breath.   Musculoskeletal: Negative for muscle cramps and  myalgias.  Gastrointestinal: Negative for abdominal pain, constipation, diarrhea, hematemesis, hematochezia, melena, nausea and vomiting.  Neurological: Negative for dizziness and light-headedness.    PHYSICAL EXAM: Vitals with BMI 07/27/2020 03/27/2016 03/27/2016  Height 5' 6"  - -  Weight 165 lbs - -  BMI 08.67 - -  Systolic 619 509 326  Diastolic 84 94 93  Pulse 70 72 78   CONSTITUTIONAL: Well-developed and well-nourished. No acute distress.  SKIN: Skin is warm and dry. No rash noted. No cyanosis. No pallor. No jaundice HEAD: Normocephalic and atraumatic.  EYES: No scleral icterus MOUTH/THROAT: Moist oral membranes.  NECK: No JVD present. No thyromegaly noted. No carotid bruits  LYMPHATIC: No visible cervical adenopathy.  CHEST Normal respiratory effort. No intercostal retractions  LUNGS: Clear to auscultation bilaterally.  No stridor. No wheezes. No rales.  CARDIOVASCULAR: Regular rate and rhythm, positive S1-S2, no murmurs rubs or gallops appreciated. ABDOMINAL: Soft, nontender, nondistended, positive bowel sounds all 4 quadrants. No apparent ascites.  EXTREMITIES: No peripheral edema  HEMATOLOGIC: No significant bruising NEUROLOGIC: Oriented to person, place, and time. Nonfocal. Normal muscle tone.  PSYCHIATRIC: Normal mood and affect. Normal behavior. Cooperative  CARDIAC DATABASE: EKG: 07/27/2020: Normal sinus rhythm, 65 bpm, normal axis, consider old anteroseptal infarct, TWI in leads V3-V4, without underlying injury pattern.  Echocardiogram: No results found for this or any previous visit from the past 1095 days.   Coronary artery calcification scoring performed on 07/17/2020 at [facility]: Left main: 0 Left anterior descending:43 Left circumflex:103 Right coronary artery:0 Total calcium score: 152 AU  Stress Testing: No results found for this or any previous visit from the past 1095 days.  Heart Catheterization: None  LABORATORY DATA: CBC Latest Ref Rng &  Units 03/06/2010  WBC 4.0 - 10.5 K/uL 12.1(H)  Hemoglobin 12.0 - 15.0 g/dL 15.8(H)  Hematocrit 36.0 - 46.0 % 45.4  Platelets 150 - 400 K/uL 429(H)    CMP Latest Ref Rng & Units 03/06/2010 04/02/2009  Glucose 70 - 99 mg/dL 102(H) 75  BUN 6 - 23 mg/dL 15 12  Creatinine 0.4 - 1.2 mg/dL 1.01 0.87  Sodium 135 - 145 mEq/L 141 134(L)  Potassium 3.5 - 5.1 mEq/L 4.6 3.6  Chloride 96 - 112 mEq/L 107 97  CO2 19 - 32 mEq/L 23 30  Calcium 8.4 - 10.5 mg/dL 9.6 8.9  Total Protein 6.0 - 8.3 g/dL 7.9 -  Total Bilirubin 0.3 - 1.2 mg/dL 1.1 -  Alkaline Phos 39 - 117 U/L 51 -  AST 0 - 37 U/L 28 -  ALT 0 - 35 U/L 17 -    Lipid Panel  No results found for: CHOL, TRIG, HDL, CHOLHDL, VLDL, LDLCALC, LDLDIRECT, LABVLDL  No components found for: NTPROBNP No results for input(s): PROBNP in the last 8760 hours. No results for input(s): TSH in the last 8760 hours.  BMP No results for input(s): NA, K, CL, CO2, GLUCOSE, BUN, CREATININE, CALCIUM, GFRNONAA, GFRAA in the last 8760 hours.  HEMOGLOBIN A1C No results found for: HGBA1C, MPG   External Labs: Collected: 06/18/2020 Creatinine 0.8 mg/dL. eGFR: >60 mL/min per 1.73 m Hemoglobin 12.3 g/dL, hematocrit 36.8%  Lipid profile: Total cholesterol 290, triglycerides 43, HDL 135, LDL 146, non-HDL155 TSH: 3.09  IMPRESSION:    ICD-10-CM   1. Coronary atherosclerosis due to calcified coronary lesion  I25.10 EKG 12-Lead   I25.84 PCV MYOCARDIAL PERFUSION WO LEXISCAN    PCV ECHOCARDIOGRAM COMPLETE    aspirin EC 81 MG tablet    atorvastatin (LIPITOR) 20 MG tablet    SARS-COV-2 RNA,(COVID-19) QUAL NAAT  2. Benign hypertension  I10   3. Hypercholesterolemia  E78.00   4. Former smoker  Z87.891      RECOMMENDATIONS: Veronica Wood is a 70 y.o. female whose past medical history and cardiac risk factors include: Hypertension, hypercholesterolemia, moderate coronary artery calcification, hypothyroidism, former smoker, advanced age, postmenopausal female.    Despite her excellent HDL levels, patient's LDL is still above the recommended levels.  In addition she has moderate coronary artery calcification based on the most recent coronary calcium scoring report.  Given her estimated 10-year risk of ASCVD is greater than 7.5%.  Therefore recommend aspirin 81 mg p.o. daily and statin therapy.  Patient is agreeable.  We will start Lipitor 20 mg p.o. nightly.  Given her risk factors as discussed above, EKG findings, and coronary artery calcification would recommend stress test to rule out reversible ischemia.  Recommend exercise nuclear stress test to evaluate for functional status, and reversible ischemia.  Covid screen prior to stress testing.  Patient is also asked to hold atenolol 2 days prior to her stress test.  Echocardiogram will be ordered to evaluate for structural heart disease and left ventricular systolic function.  Patient's blood pressure is within acceptable range.  Currently managed by primary care provider.  Educated importance of complete smoking cessation.  FINAL MEDICATION LIST END OF ENCOUNTER: Meds ordered this encounter  Medications  . aspirin EC 81 MG tablet    Sig: Take 1 tablet (81 mg total) by mouth daily. Swallow whole.    Dispense:  90 tablet    Refill:  3  . atorvastatin (LIPITOR) 20 MG tablet    Sig: Take 1 tablet (20 mg total) by mouth at bedtime.    Dispense:  90 tablet    Refill:  0     Current Outpatient Medications:  .  amLODipine (NORVASC) 5 MG tablet, Take 5 mg by mouth daily., Disp: , Rfl:  .  aspirin EC 81 MG tablet, Take 1 tablet (81 mg total) by mouth daily. Swallow whole., Disp: 90 tablet, Rfl: 3 .  atenolol (TENORMIN) 25 MG tablet, Take 25 mg by mouth 2 (two) times daily., Disp: , Rfl:  .  atorvastatin (LIPITOR) 20 MG tablet, Take 1 tablet (20 mg total) by mouth at bedtime., Disp: 90 tablet, Rfl: 0 .  Calcium 250 MG CAPS, Take 500 mg by mouth 2 (two) times daily., Disp: , Rfl:  .  docusate sodium  (COLACE) 100 MG capsule, 1 capsule as needed, Disp: , Rfl:  .  DULoxetine (CYMBALTA) 60 MG capsule, Take 60 mg by mouth daily., Disp: , Rfl: 4 .  lamoTRIgine (LAMICTAL) 200 MG tablet, Take 200 mg by mouth daily., Disp: , Rfl: 8 .  levothyroxine (SYNTHROID, LEVOTHROID) 100 MCG tablet, Take 50 mcg by mouth every morning. , Disp: , Rfl: 0 .  LORazepam (ATIVAN) 0.5 MG tablet, Take 0.5 mg by mouth 2 (two) times daily as needed., Disp: , Rfl:  .  MAGNESIUM PO, Take 1 tablet by mouth daily., Disp: , Rfl:  .  QUEtiapine (SEROQUEL) 200 MG tablet, Take 400-600  mg by mouth at bedtime., Disp: , Rfl: 12  Orders Placed This Encounter  Procedures  . SARS-COV-2 RNA,(COVID-19) QUAL NAAT  . PCV MYOCARDIAL PERFUSION WO LEXISCAN  . EKG 12-Lead  . PCV ECHOCARDIOGRAM COMPLETE    Patient Instructions  Hold Atenolol 2 days before stress test.    --Continue cardiac medications as reconciled in final medication list. --Return in about 4 weeks (around 08/24/2020) for Review test results. Or sooner if needed. --Continue follow-up with your primary care physician regarding the management of your other chronic comorbid conditions.  Patient's questions and concerns were addressed to her satisfaction. She voices understanding of the instructions provided during this encounter.   This note was created using a voice recognition software as a result there may be grammatical errors inadvertently enclosed that do not reflect the nature of this encounter. Every attempt is made to correct such errors.  Rex Kras, [place], [facility]  Pager: [phone] Office: [phone]

## 2020-07-27 NOTE — Patient Instructions (Signed)
Hold Atenolol 2 days before stress test.

## 2020-07-28 DIAGNOSIS — M8580 Other specified disorders of bone density and structure, unspecified site: Secondary | ICD-10-CM | POA: Diagnosis not present

## 2020-07-28 DIAGNOSIS — E039 Hypothyroidism, unspecified: Secondary | ICD-10-CM | POA: Diagnosis not present

## 2020-07-28 DIAGNOSIS — I10 Essential (primary) hypertension: Secondary | ICD-10-CM | POA: Diagnosis not present

## 2020-07-28 DIAGNOSIS — E7800 Pure hypercholesterolemia, unspecified: Secondary | ICD-10-CM | POA: Diagnosis not present

## 2020-08-02 ENCOUNTER — Ambulatory Visit: Payer: Medicare Other

## 2020-08-02 ENCOUNTER — Other Ambulatory Visit: Payer: Self-pay

## 2020-08-02 DIAGNOSIS — I2510 Atherosclerotic heart disease of native coronary artery without angina pectoris: Secondary | ICD-10-CM | POA: Diagnosis not present

## 2020-08-02 DIAGNOSIS — I2584 Coronary atherosclerosis due to calcified coronary lesion: Secondary | ICD-10-CM | POA: Diagnosis not present

## 2020-08-03 ENCOUNTER — Other Ambulatory Visit (HOSPITAL_COMMUNITY)
Admission: RE | Admit: 2020-08-03 | Discharge: 2020-08-03 | Disposition: A | Discharge status: Home / Self Care | Payer: Medicare Other | Source: Ambulatory Visit | Attending: Cardiology | Admitting: Cardiology

## 2020-08-03 DIAGNOSIS — Z20822 Contact with and (suspected) exposure to covid-19: Secondary | ICD-10-CM | POA: Diagnosis not present

## 2020-08-03 LAB — SARS CORONAVIRUS 2 (TAT 6-24 HRS): SARS Coronavirus 2: NEGATIVE

## 2020-08-06 ENCOUNTER — Other Ambulatory Visit: Payer: Self-pay

## 2020-08-06 ENCOUNTER — Ambulatory Visit: Payer: Medicare Other

## 2020-08-06 DIAGNOSIS — I2584 Coronary atherosclerosis due to calcified coronary lesion: Secondary | ICD-10-CM | POA: Diagnosis not present

## 2020-08-06 DIAGNOSIS — I2510 Atherosclerotic heart disease of native coronary artery without angina pectoris: Secondary | ICD-10-CM

## 2020-08-13 NOTE — Progress Notes (Signed)
Called pt to inform her about her stress test. Pt is award

## 2020-08-20 DIAGNOSIS — I2510 Atherosclerotic heart disease of native coronary artery without angina pectoris: Secondary | ICD-10-CM | POA: Diagnosis not present

## 2020-08-20 DIAGNOSIS — I10 Essential (primary) hypertension: Secondary | ICD-10-CM | POA: Diagnosis not present

## 2020-08-20 DIAGNOSIS — J439 Emphysema, unspecified: Secondary | ICD-10-CM | POA: Diagnosis not present

## 2020-08-24 ENCOUNTER — Other Ambulatory Visit: Payer: Self-pay | Admitting: Cardiology

## 2020-08-24 ENCOUNTER — Ambulatory Visit: Payer: Medicare Other | Admitting: Cardiology

## 2020-08-24 ENCOUNTER — Encounter: Payer: Self-pay | Admitting: Cardiology

## 2020-08-24 ENCOUNTER — Other Ambulatory Visit: Payer: Self-pay

## 2020-08-24 VITALS — BP 126/72 | HR 70 | Temp 97.7°F | Resp 16 | Ht 66.0 in | Wt 161.0 lb

## 2020-08-24 DIAGNOSIS — E7800 Pure hypercholesterolemia, unspecified: Secondary | ICD-10-CM | POA: Diagnosis not present

## 2020-08-24 DIAGNOSIS — Z87891 Personal history of nicotine dependence: Secondary | ICD-10-CM | POA: Diagnosis not present

## 2020-08-24 DIAGNOSIS — I2510 Atherosclerotic heart disease of native coronary artery without angina pectoris: Secondary | ICD-10-CM

## 2020-08-24 DIAGNOSIS — I2584 Coronary atherosclerosis due to calcified coronary lesion: Secondary | ICD-10-CM

## 2020-08-24 DIAGNOSIS — I10 Essential (primary) hypertension: Secondary | ICD-10-CM

## 2020-08-24 NOTE — Progress Notes (Signed)
Date:  08/24/2020   ID:  DEEANNA BEIGHTOL, DOB [birth year], MRN [id number]  PCP:  Deland Pretty, MD  Cardiologist:  Rex Kras, DO, [facility] (established care 07/27/2020)  Date: 08/24/20 Last Office Visit: 07/27/2020  Chief Complaint  Patient presents with   Follow-up    4 week   Results    Lab, echo, and stress test    HPI  Veronica Wood is a 70 y.o. female who presents to the office with a chief complaint of " review test results." Patient's past medical history and cardiovascular risk factors include: Hypertension, hypercholesterolemia, moderate coronary artery calcification, hypothyroidism, former smoker, advanced age, postmenopausal female.   She is referred to the office at the request of Deland Pretty, MD for evaluation of coronary disease.  Patient states that she recently had a visit with her primary care provider and was noted to have hyperlipidemia.  However, given her LDL being above normal limits this shared decision was to proceed with coronary artery calcium scoring for further stratification. Patient was found to have moderate coronary artery calcification now referred to cardiology for further evaluation and management.  At the last office visit patient did not have any chest pain or anginal equivalent.  However due to moderate coronary artery calcification the shared decision was to proceed with echocardiogram and stress test for further stratification.  Patient is noted to have a preserved LVEF, normal diastolic filling pattern, and mild mitral regurgitation on the most recent echocardiogram.  And her nuclear stress test notes normal myocardial perfusion overall a low risk study.  Since last office visit she denies any cardiovascular symptoms or hospitalizations.  She was started on Lipitor at the last office visit and has tolerated the medication well without any side effects or intolerances.  FUNCTIONAL STATUS: No structured exercise program or daily routine.    ALLERGIES: Allergies  Allergen Reactions   Celexa [Citalopram Hydrobromide] Rash    MEDICATION LIST PRIOR TO VISIT: Current Meds  Medication Sig   amLODipine (NORVASC) 5 MG tablet Take 5 mg by mouth daily.   aspirin EC 81 MG tablet Take 1 tablet (81 mg total) by mouth daily. Swallow whole.   atenolol (TENORMIN) 25 MG tablet Take 25 mg by mouth 2 (two) times daily.   atorvastatin (LIPITOR) 20 MG tablet Take 1 tablet (20 mg total) by mouth at bedtime.   Calcium 250 MG CAPS Take 500 mg by mouth 2 (two) times daily.   docusate sodium (COLACE) 100 MG capsule 1 capsule as needed   DULoxetine (CYMBALTA) 60 MG capsule Take 60 mg by mouth daily.   lamoTRIgine (LAMICTAL) 200 MG tablet Take 200 mg by mouth daily.   levothyroxine (SYNTHROID, LEVOTHROID) 100 MCG tablet Take 50 mcg by mouth every morning.    LORazepam (ATIVAN) 0.5 MG tablet Take 0.5 mg by mouth 2 (two) times daily as needed.   MAGNESIUM PO Take 1 tablet by mouth daily.   QUEtiapine (SEROQUEL) 200 MG tablet Take 400-600 mg by mouth at bedtime.     PAST MEDICAL HISTORY: Past Medical History:  Diagnosis Date   Anxiety    Coronary atherosclerosis due to calcified coronary lesion    Depression    Hyperlipidemia    Hypertension    Thyroid disease     PAST SURGICAL HISTORY: Past Surgical History:  Procedure Laterality Date   COLONOSCOPY     DILATION AND CURETTAGE OF UTERUS      FAMILY HISTORY: The patient family history includes Pulmonary embolism in  her father; Stroke in her mother.  SOCIAL HISTORY:  The patient  reports that she quit smoking about 35 years ago. Her smoking use included cigarettes. She has a 40.00 pack-year smoking history. She has never used smokeless tobacco. She reports current alcohol use of about 1.0 standard drink of alcohol per week. She reports that she does not use drugs.  REVIEW OF SYSTEMS: Review of Systems  Constitutional: Negative for chills and fever.  HENT:  Negative for hoarse voice and nosebleeds.   Eyes: Negative for discharge, double vision and pain.  Cardiovascular: Negative for chest pain, claudication, dyspnea on exertion, leg swelling, near-syncope, orthopnea, palpitations, paroxysmal nocturnal dyspnea and syncope.  Respiratory: Negative for hemoptysis and shortness of breath.   Musculoskeletal: Negative for muscle cramps and myalgias.  Gastrointestinal: Negative for abdominal pain, constipation, diarrhea, hematemesis, hematochezia, melena, nausea and vomiting.  Neurological: Negative for dizziness and light-headedness.    PHYSICAL EXAM: Vitals with BMI 08/24/2020 07/27/2020 03/27/2016  Height 5' 6"  5' 6"  -  Weight 161 lbs 165 lbs -  BMI 26 48.18 -  Systolic 563 149 702  Diastolic 72 84 94  Pulse 70 70 72   CONSTITUTIONAL: Well-developed and well-nourished. No acute distress.  SKIN: Skin is warm and dry. No rash noted. No cyanosis. No pallor. No jaundice HEAD: Normocephalic and atraumatic.  EYES: No scleral icterus MOUTH/THROAT: Moist oral membranes.  NECK: No JVD present. No thyromegaly noted. No carotid bruits  LYMPHATIC: No visible cervical adenopathy.  CHEST Normal respiratory effort. No intercostal retractions  LUNGS: Clear to auscultation bilaterally.  No stridor. No wheezes. No rales.  CARDIOVASCULAR: Regular rate and rhythm, positive S1-S2, no murmurs rubs or gallops appreciated. ABDOMINAL: Soft, nontender, nondistended, positive bowel sounds all 4 quadrants. No apparent ascites.  EXTREMITIES: No peripheral edema  HEMATOLOGIC: No significant bruising NEUROLOGIC: Oriented to person, place, and time. Nonfocal. Normal muscle tone.  PSYCHIATRIC: Normal mood and affect. Normal behavior. Cooperative  CARDIAC DATABASE: EKG: 07/27/2020: Normal sinus rhythm, 65 bpm, normal axis, consider old anteroseptal infarct, TWI in leads V3-V4, without underlying injury pattern.  Echocardiogram: 08/02/2020:  Normal LV systolic function  with visual EF 55-60%. Left ventricle cavity  is normal in size. Normal global wall motion. Normal diastolic filling  pattern, normal LAP.  Mild (Grade I) mitral regurgitation.  No other significant valvular abnormalities.  No prior study for comparison.   Coronary artery calcification scoring performed on 07/17/2020 at [facility]: Left main: 0 Left anterior descending:43 Left circumflex:103 Right coronary artery:0 Total calcium score: 152 AU  Stress Testing: Lexiscan/modified Bruce Tetrofosmin stress test 08/06/2020: Lexiscan/modified Bruce nuclear stress test performed using 1-day protocol. patient reached 64% of MPHR and achieved 1 MET.  Stress EKG is non-diagnostic, as this is pharmacological stress test. In addition, stress EKG showed sinus rhythm, nonspecific ST-T changes inferior leads.  Normal myocardial perfusion. Stress LVEF 73%. Low risk study.   Heart Catheterization: None  LABORATORY DATA: CBC Latest Ref Rng & Units 03/06/2010  WBC 4.0 - 10.5 K/uL 12.1(H)  Hemoglobin 12.0 - 15.0 g/dL 15.8(H)  Hematocrit 36.0 - 46.0 % 45.4  Platelets 150 - 400 K/uL 429(H)    CMP Latest Ref Rng & Units 03/06/2010 04/02/2009  Glucose 70 - 99 mg/dL 102(H) 75  BUN 6 - 23 mg/dL 15 12  Creatinine 0.4 - 1.2 mg/dL 1.01 0.87  Sodium 135 - 145 mEq/L 141 134(L)  Potassium 3.5 - 5.1 mEq/L 4.6 3.6  Chloride 96 - 112 mEq/L 107 97  CO2 19 -  32 mEq/L 23 30  Calcium 8.4 - 10.5 mg/dL 9.6 8.9  Total Protein 6.0 - 8.3 g/dL 7.9 -  Total Bilirubin 0.3 - 1.2 mg/dL 1.1 -  Alkaline Phos 39 - 117 U/L 51 -  AST 0 - 37 U/L 28 -  ALT 0 - 35 U/L 17 -    Lipid Panel  No results found for: CHOL, TRIG, HDL, CHOLHDL, VLDL, LDLCALC, LDLDIRECT, LABVLDL  No components found for: NTPROBNP No results for input(s): PROBNP in the last 8760 hours. No results for input(s): TSH in the last 8760 hours.  BMP No results for input(s): NA, K, CL, CO2, GLUCOSE, BUN, CREATININE, CALCIUM, GFRNONAA, GFRAA in the last 8760  hours.  HEMOGLOBIN A1C No results found for: HGBA1C, MPG   External Labs: Collected: 06/18/2020 Creatinine 0.8 mg/dL. eGFR: >60 mL/min per 1.73 m Hemoglobin 12.3 g/dL, hematocrit 36.8% Lipid profile: Total cholesterol 290, triglycerides 43, HDL 135, LDL 146, non-HDL155 TSH: 3.09  IMPRESSION:    ICD-10-CM   1. Coronary atherosclerosis due to calcified coronary lesion  I25.10 Comp. Metabolic Panel (12)   N23.55 Lipid Panel With LDL/HDL Ratio    LDL cholesterol, direct  2. Benign hypertension  I10   3. Hypercholesterolemia  E78.00 Comp. Metabolic Panel (12)    Lipid Panel With LDL/HDL Ratio    LDL cholesterol, direct  4. Former smoker  Z87.891      RECOMMENDATIONS: MYKALAH SAARI is a 70 y.o. female whose past medical history and cardiac risk factors include: Hypertension, hypercholesterolemia, moderate coronary artery calcification, hypothyroidism, former smoker, advanced age, postmenopausal female.   At last office visit the shared decision was to initiate statin therapy due to her moderate coronary artery calcification and her estimated 10-year risk of ASCVD greater than 7.5%.  She is tolerated Lipitor 20 mg p.o. nightly without any significant side effects or intolerances.  She is also on aspirin 81 mg p.o. daily.  Given the coronary artery calcification she underwent an ischemic evaluation to rule out reversible ischemia.  Her nuclear stress test was noted to be low risk.  Recommend rechecking her liver function test and a fasting lipid profile in 6 weeks.  Labs ordered.  Patient states that she has an upcoming appointment with her PCP and she may have the same testing done prior to her visit.  I informed her not to do double lab work and her PCP labs may suffice.  If she gets her blood work done prior to her PCP I have asked her to send me a copy for review.  Educated on the importance of improving her modifiable cardiovascular risk factors.   Benign essential hypertension:  Patient's blood pressure is within acceptable range.  Currently managed by primary care provider.  Former smoker: Educated importance of complete smoking cessation.  Recommend 45-monthfollow-up to reevaluate medical therapy and thereafter yearly follow-up or sooner if change in medical condition  FINAL MEDICATION LIST END OF ENCOUNTER: No orders of the defined types were placed in this encounter.    Current Outpatient Medications:    amLODipine (NORVASC) 5 MG tablet, Take 5 mg by mouth daily., Disp: , Rfl:    aspirin EC 81 MG tablet, Take 1 tablet (81 mg total) by mouth daily. Swallow whole., Disp: 90 tablet, Rfl: 3   atenolol (TENORMIN) 25 MG tablet, Take 25 mg by mouth 2 (two) times daily., Disp: , Rfl:    atorvastatin (LIPITOR) 20 MG tablet, Take 1 tablet (20 mg total) by mouth at bedtime., Disp:  90 tablet, Rfl: 0   Calcium 250 MG CAPS, Take 500 mg by mouth 2 (two) times daily., Disp: , Rfl:    docusate sodium (COLACE) 100 MG capsule, 1 capsule as needed, Disp: , Rfl:    DULoxetine (CYMBALTA) 60 MG capsule, Take 60 mg by mouth daily., Disp: , Rfl: 4   lamoTRIgine (LAMICTAL) 200 MG tablet, Take 200 mg by mouth daily., Disp: , Rfl: 8   levothyroxine (SYNTHROID, LEVOTHROID) 100 MCG tablet, Take 50 mcg by mouth every morning. , Disp: , Rfl: 0   LORazepam (ATIVAN) 0.5 MG tablet, Take 0.5 mg by mouth 2 (two) times daily as needed., Disp: , Rfl:    MAGNESIUM PO, Take 1 tablet by mouth daily., Disp: , Rfl:    QUEtiapine (SEROQUEL) 200 MG tablet, Take 400-600 mg by mouth at bedtime., Disp: , Rfl: 12   losartan (COZAAR) 25 MG tablet, Take 25 mg by mouth daily., Disp: , Rfl:   Orders Placed This Encounter  Procedures   Comp. Metabolic Panel (12)   Lipid Panel With LDL/HDL Ratio   LDL cholesterol, direct    There are no Patient Instructions on file for this visit.  --Continue cardiac medications as reconciled in final medication list. --Return in about 6 months (around  02/21/2021) for Follow up, Coronary artery calcification. Or sooner if needed. --Continue follow-up with your primary care physician regarding the management of your other chronic comorbid conditions.  Patient's questions and concerns were addressed to her satisfaction. She voices understanding of the instructions provided during this encounter.   This note was created using a voice recognition software as a result there may be grammatical errors inadvertently enclosed that do not reflect the nature of this encounter. Every attempt is made to correct such errors.  Total time spent: 33 minutes.  Rex Kras, [place], [facility]  Pager: [phone] Office: [phone]

## 2020-08-27 DIAGNOSIS — E7800 Pure hypercholesterolemia, unspecified: Secondary | ICD-10-CM | POA: Diagnosis not present

## 2020-08-27 DIAGNOSIS — E039 Hypothyroidism, unspecified: Secondary | ICD-10-CM | POA: Diagnosis not present

## 2020-08-27 DIAGNOSIS — I10 Essential (primary) hypertension: Secondary | ICD-10-CM | POA: Diagnosis not present

## 2020-08-28 DIAGNOSIS — I2584 Coronary atherosclerosis due to calcified coronary lesion: Secondary | ICD-10-CM | POA: Diagnosis not present

## 2020-08-28 DIAGNOSIS — E7800 Pure hypercholesterolemia, unspecified: Secondary | ICD-10-CM | POA: Diagnosis not present

## 2020-08-28 DIAGNOSIS — I2510 Atherosclerotic heart disease of native coronary artery without angina pectoris: Secondary | ICD-10-CM | POA: Diagnosis not present

## 2020-08-29 LAB — LDL CHOLESTEROL, DIRECT: LDL Direct: 72 mg/dL (ref 0–99)

## 2020-08-29 LAB — COMP. METABOLIC PANEL (12)
AST: 34 IU/L (ref 0–40)
Albumin/Globulin Ratio: 2 (ref 1.2–2.2)
Albumin: 4.9 g/dL — ABNORMAL HIGH (ref 3.8–4.8)
Alkaline Phosphatase: 93 IU/L (ref 44–121)
BUN/Creatinine Ratio: 11 — ABNORMAL LOW (ref 12–28)
BUN: 8 mg/dL (ref 8–27)
Bilirubin Total: 0.4 mg/dL (ref 0.0–1.2)
Calcium: 9.9 mg/dL (ref 8.7–10.3)
Chloride: 98 mmol/L (ref 96–106)
Creatinine, Ser: 0.76 mg/dL (ref 0.57–1.00)
Globulin, Total: 2.5 g/dL (ref 1.5–4.5)
Glucose: 95 mg/dL (ref 65–99)
Potassium: 4.7 mmol/L (ref 3.5–5.2)
Sodium: 136 mmol/L (ref 134–144)
Total Protein: 7.4 g/dL (ref 6.0–8.5)
eGFR: 85 mL/min/{1.73_m2} (ref >59)

## 2020-08-29 LAB — LIPID PANEL WITH LDL/HDL RATIO
Cholesterol, Total: 217 mg/dL — ABNORMAL HIGH (ref 100–199)
HDL: 115 mg/dL (ref >39)
LDL Chol Calc (NIH): 92 mg/dL (ref 0–99)
LDL/HDL Ratio: 0.8 ratio (ref 0.0–3.2)
Triglycerides: 57 mg/dL (ref 0–149)
VLDL Cholesterol Cal: 10 mg/dL (ref 5–40)

## 2020-08-29 NOTE — Progress Notes (Signed)
Patient is aware 

## 2020-08-30 DIAGNOSIS — I2510 Atherosclerotic heart disease of native coronary artery without angina pectoris: Secondary | ICD-10-CM | POA: Diagnosis not present

## 2020-08-31 ENCOUNTER — Other Ambulatory Visit (HOSPITAL_COMMUNITY): Payer: BLUE CROSS/BLUE SHIELD

## 2020-09-24 DIAGNOSIS — Z23 Encounter for immunization: Secondary | ICD-10-CM | POA: Diagnosis not present

## 2020-09-27 DIAGNOSIS — E039 Hypothyroidism, unspecified: Secondary | ICD-10-CM | POA: Diagnosis not present

## 2020-09-27 DIAGNOSIS — E7800 Pure hypercholesterolemia, unspecified: Secondary | ICD-10-CM | POA: Diagnosis not present

## 2020-09-27 DIAGNOSIS — I10 Essential (primary) hypertension: Secondary | ICD-10-CM | POA: Diagnosis not present

## 2020-10-03 ENCOUNTER — Other Ambulatory Visit: Payer: Self-pay | Admitting: Cardiology

## 2020-10-03 DIAGNOSIS — I2584 Coronary atherosclerosis due to calcified coronary lesion: Secondary | ICD-10-CM

## 2020-10-03 DIAGNOSIS — I2510 Atherosclerotic heart disease of native coronary artery without angina pectoris: Secondary | ICD-10-CM

## 2020-10-09 DIAGNOSIS — I25111 Atherosclerotic heart disease of native coronary artery with angina pectoris with documented spasm: Secondary | ICD-10-CM | POA: Diagnosis not present

## 2020-10-09 DIAGNOSIS — I10 Essential (primary) hypertension: Secondary | ICD-10-CM | POA: Diagnosis not present

## 2020-10-09 DIAGNOSIS — S0003XA Contusion of scalp, initial encounter: Secondary | ICD-10-CM | POA: Diagnosis not present

## 2020-11-22 ENCOUNTER — Other Ambulatory Visit: Payer: Self-pay | Admitting: Cardiology

## 2020-11-22 DIAGNOSIS — I2510 Atherosclerotic heart disease of native coronary artery without angina pectoris: Secondary | ICD-10-CM

## 2020-12-17 DIAGNOSIS — Z6825 Body mass index (BMI) 25.0-25.9, adult: Secondary | ICD-10-CM | POA: Diagnosis not present

## 2020-12-17 DIAGNOSIS — Z01419 Encounter for gynecological examination (general) (routine) without abnormal findings: Secondary | ICD-10-CM | POA: Diagnosis not present

## 2020-12-24 DIAGNOSIS — Z1231 Encounter for screening mammogram for malignant neoplasm of breast: Secondary | ICD-10-CM | POA: Diagnosis not present

## 2020-12-27 DIAGNOSIS — E039 Hypothyroidism, unspecified: Secondary | ICD-10-CM | POA: Diagnosis not present

## 2020-12-27 DIAGNOSIS — E7800 Pure hypercholesterolemia, unspecified: Secondary | ICD-10-CM | POA: Diagnosis not present

## 2020-12-27 DIAGNOSIS — I10 Essential (primary) hypertension: Secondary | ICD-10-CM | POA: Diagnosis not present

## 2021-02-05 DIAGNOSIS — Z23 Encounter for immunization: Secondary | ICD-10-CM | POA: Diagnosis not present

## 2021-02-19 ENCOUNTER — Other Ambulatory Visit: Payer: Self-pay | Admitting: Cardiology

## 2021-02-19 DIAGNOSIS — I2510 Atherosclerotic heart disease of native coronary artery without angina pectoris: Secondary | ICD-10-CM

## 2021-02-21 ENCOUNTER — Encounter: Payer: Self-pay | Admitting: Cardiology

## 2021-02-21 ENCOUNTER — Other Ambulatory Visit: Payer: Self-pay

## 2021-02-21 ENCOUNTER — Ambulatory Visit: Payer: Medicare Other | Admitting: Cardiology

## 2021-02-21 VITALS — BP 133/75 | HR 70 | Temp 96.7°F | Resp 16 | Ht 66.0 in | Wt 160.2 lb

## 2021-02-21 DIAGNOSIS — E7800 Pure hypercholesterolemia, unspecified: Secondary | ICD-10-CM | POA: Diagnosis not present

## 2021-02-21 DIAGNOSIS — I2510 Atherosclerotic heart disease of native coronary artery without angina pectoris: Secondary | ICD-10-CM | POA: Diagnosis not present

## 2021-02-21 DIAGNOSIS — I2584 Coronary atherosclerosis due to calcified coronary lesion: Secondary | ICD-10-CM | POA: Diagnosis not present

## 2021-02-21 DIAGNOSIS — Z87891 Personal history of nicotine dependence: Secondary | ICD-10-CM

## 2021-02-21 DIAGNOSIS — I10 Essential (primary) hypertension: Secondary | ICD-10-CM

## 2021-02-21 NOTE — Progress Notes (Signed)
Date:  02/21/2021   ID:  Veronica Wood, DOB Dec 23, 1950, MRN [id number]  PCP:  Deland Pretty, MD  Cardiologist:  Rex Kras, DO, [facility] (established care 07/27/2020)  Date: 02/21/21 Last Office Visit: 08/24/2020   Chief Complaint  Patient presents with   Coronary artery calcification   Follow-up    HPI  Veronica Wood is a 70 y.o. female who presents to the office with a chief complaint of " 60-monthfollow-up known coronary calcium." Patient's past medical history and cardiovascular risk factors include: Hypertension, hypercholesterolemia, moderate coronary artery calcification, hypothyroidism, former smoker, advanced age, postmenopausal female.   She is referred to the office at the request of PDeland Pretty MD for evaluation of coronary disease.  During her prior visits with PCP she was noted to have hyperlipidemia.  For further restratification underwent coronary calcium scoring and she was found to have moderate amount of coronary calcification and referred to cardiology for further evaluation and management.  She underwent an ischemic evaluation as outlined below and started on medical therapy and now presents for 623-monthollow-up.  Since last office visit patient has not had any chest pain or shortness of breath at rest or with effort related activities.  She is overall euvolemic and not in congestive heart failure no symptoms to suggest ACS.  She has not been seen at urgent care or hospitalization for cardiovascular symptoms.  She is tolerating her medical therapy well without any side effects or intolerances.  Most recent labs from 08/28/2020 independently reviewed with her at today's visit.  Her LDL has improved with a direct LDL being 72 mg/dL and her liver function tests are within normal limits.  FUNCTIONAL STATUS: No structured exercise program or daily routine.   ALLERGIES: Allergies  Allergen Reactions   Celexa [Citalopram Hydrobromide] Rash    MEDICATION LIST  PRIOR TO VISIT: Current Meds  Medication Sig   amLODipine (NORVASC) 5 MG tablet Take 5 mg by mouth daily.   aspirin EC 81 MG tablet Take 1 tablet (81 mg total) by mouth daily. Swallow whole.   atenolol (TENORMIN) 25 MG tablet Take 25 mg by mouth 2 (two) times daily.   atorvastatin (LIPITOR) 20 MG tablet TAKE 1 TABLET BY MOUTH EVERYDAY AT BEDTIME   Calcium 250 MG CAPS Take 500 mg by mouth 2 (two) times daily.   docusate sodium (COLACE) 100 MG capsule 1 capsule as needed   DULoxetine (CYMBALTA) 60 MG capsule Take 60 mg by mouth daily.   lamoTRIgine (LAMICTAL) 200 MG tablet Take 200 mg by mouth daily.   levothyroxine (SYNTHROID, LEVOTHROID) 100 MCG tablet Take 50 mcg by mouth every morning.    LORazepam (ATIVAN) 0.5 MG tablet Take 0.5 mg by mouth 2 (two) times daily as needed.   losartan (COZAAR) 25 MG tablet Take 25 mg by mouth daily.   MAGNESIUM PO Take 1 tablet by mouth daily.   QUEtiapine (SEROQUEL) 200 MG tablet Take 400-600 mg by mouth at bedtime.     PAST MEDICAL HISTORY: Past Medical History:  Diagnosis Date   Anxiety    Coronary atherosclerosis due to calcified coronary lesion    Depression    Hyperlipidemia    Hypertension    Thyroid disease     PAST SURGICAL HISTORY: Past Surgical History:  Procedure Laterality Date   COLONOSCOPY     DILATION AND CURETTAGE OF UTERUS      FAMILY HISTORY: The patient family history includes Pulmonary embolism in her father; Stroke in her mother.  SOCIAL HISTORY:  The patient  reports that she quit smoking about 35 years ago. Her smoking use included cigarettes. She has a 40.00 pack-year smoking history. She has never used smokeless tobacco. She reports current alcohol use of about 1.0 standard drink per week. She reports that she does not use drugs.  REVIEW OF SYSTEMS: Review of Systems  Constitutional: Negative for chills and fever.  HENT:  Negative for hoarse voice and nosebleeds.   Eyes:  Negative for discharge, double vision  and pain.  Cardiovascular:  Negative for chest pain, claudication, dyspnea on exertion, leg swelling, near-syncope, orthopnea, palpitations, paroxysmal nocturnal dyspnea and syncope.  Respiratory:  Negative for hemoptysis and shortness of breath.   Musculoskeletal:  Negative for muscle cramps and myalgias.  Gastrointestinal:  Negative for abdominal pain, constipation, diarrhea, hematemesis, hematochezia, melena, nausea and vomiting.  Neurological:  Negative for dizziness and light-headedness.   PHYSICAL EXAM: Vitals with BMI 02/21/2021 08/24/2020 07/27/2020  Height 5' 6"  5' 6"  5' 6"   Weight 160 lbs 3 oz 161 lbs 165 lbs  BMI 23.55 26 73.22  Systolic 025 427 062  Diastolic 75 72 84  Pulse 70 70 70   CONSTITUTIONAL: Well-developed and well-nourished. No acute distress.  SKIN: Skin is warm and dry. No rash noted. No cyanosis. No pallor. No jaundice HEAD: Normocephalic and atraumatic.  EYES: No scleral icterus MOUTH/THROAT: Moist oral membranes.  NECK: No JVD present. No thyromegaly noted. No carotid bruits  LYMPHATIC: No visible cervical adenopathy.  CHEST Normal respiratory effort. No intercostal retractions  LUNGS: Clear to auscultation bilaterally.  No stridor. No wheezes. No rales.  CARDIOVASCULAR: Regular rate and rhythm, positive S1-S2, no murmurs rubs or gallops appreciated. ABDOMINAL: Soft, nontender, nondistended, positive bowel sounds all 4 quadrants. No apparent ascites.  EXTREMITIES: No peripheral edema, 2+ dorsalis pedis and posterior tibial pulses. HEMATOLOGIC: No significant bruising NEUROLOGIC: Oriented to person, place, and time. Nonfocal. Normal muscle tone.  PSYCHIATRIC: Normal mood and affect. Normal behavior. Cooperative  CARDIAC DATABASE: EKG: 02/21/2021: Normal sinus rhythm, 65 bpm, nonspecific T wave abnormality.  Without underlying injury pattern.  Similar findings on prior ECG.  Echocardiogram: 08/02/2020:  Normal LV systolic function with visual EF 55-60%.  Left ventricle cavity  is normal in size. Normal global wall motion. Normal diastolic filling  pattern, normal LAP.  Mild (Grade I) mitral regurgitation.  No other significant valvular abnormalities.  No prior study for comparison.   Coronary artery calcification scoring performed on 07/17/2020 at [facility]: Left main: 0 Left anterior descending:43 Left circumflex:103 Right coronary artery:0 Total calcium score: 152 AU  Stress Testing: Lexiscan/modified Bruce Tetrofosmin stress test 08/06/2020: Lexiscan/modified Bruce nuclear stress test performed using 1-day protocol. patient reached 64% of MPHR and achieved 1 MET.  Stress EKG is non-diagnostic, as this is pharmacological stress test. In addition, stress EKG showed sinus rhythm, nonspecific ST-T changes inferior leads.  Normal myocardial perfusion. Stress LVEF 73%. Low risk study.   Heart Catheterization: None  LABORATORY DATA: CBC Latest Ref Rng & Units 03/06/2010  WBC 4.0 - 10.5 K/uL 12.1(H)  Hemoglobin 12.0 - 15.0 g/dL 15.8(H)  Hematocrit 36.0 - 46.0 % 45.4  Platelets 150 - 400 K/uL 429(H)    CMP Latest Ref Rng & Units 08/28/2020 03/06/2010 04/02/2009  Glucose 65 - 99 mg/dL 95 102(H) 75  BUN 8 - 27 mg/dL 8 15 12   Creatinine 0.57 - 1.00 mg/dL 0.76 1.01 0.87  Sodium 134 - 144 mmol/L 136 141 134(L)  Potassium 3.5 - 5.2 mmol/L 4.7  4.6 3.6  Chloride 96 - 106 mmol/L 98 107 97  CO2 19 - 32 mEq/L - 23 30  Calcium 8.7 - 10.3 mg/dL 9.9 9.6 8.9  Total Protein 6.0 - 8.5 g/dL 7.4 7.9 -  Total Bilirubin 0.0 - 1.2 mg/dL 0.4 1.1 -  Alkaline Phos 44 - 121 IU/L 93 51 -  AST 0 - 40 IU/L 34 28 -  ALT 0 - 35 U/L - 17 -    Lipid Panel     Component Value Date/Time   CHOL 217 (H) 08/28/2020 0812   TRIG 57 08/28/2020 0812   HDL 115 08/28/2020 0812   LDLCALC 92 08/28/2020 0812   LDLDIRECT 72 08/28/2020 0812   LABVLDL 10 08/28/2020 0812    No components found for: NTPROBNP No results for input(s): PROBNP in the last 8760 hours. No  results for input(s): TSH in the last 8760 hours.  BMP Recent Labs    08/28/20 0812  NA 136  K 4.7  CL 98  GLUCOSE 95  BUN 8  CREATININE 0.76  CALCIUM 9.9    HEMOGLOBIN A1C No results found for: HGBA1C, MPG   External Labs: Collected: 06/18/2020 Creatinine 0.8 mg/dL. eGFR: >60 mL/min per 1.73 m Hemoglobin 12.3 g/dL, hematocrit 36.8% Lipid profile: Total cholesterol 290, triglycerides 43, HDL 135, LDL 146, non-HDL155 TSH: 3.09  IMPRESSION:    ICD-10-CM   1. Coronary atherosclerosis due to calcified coronary lesion  I25.10 EKG 12-Lead   I25.84     2. Benign hypertension  I10     3. Hypercholesterolemia  E78.00     4. Former smoker  Z87.891        RECOMMENDATIONS: Veronica Wood is a 70 y.o. female whose past medical history and cardiac risk factors include: Hypertension, hypercholesterolemia, moderate coronary artery calcification, hypothyroidism, former smoker, advanced age, postmenopausal female.   Coronary atherosclerosis due to calcified coronary lesion Total calcium score of 152AU Given her coronary calcification, her estimated 10-year risk of ASCVD greater than 7.5% during prior office visits, patient was started on statin therapy. Continue aspirin and statin therapy. EKG reviewed/interpreted findings noted above.. Chest pain-free.  Hypercholesterolemia Better controlled. Most recent labs from 08/28/2020 independently reviewed.  Direct LDL is 72 mg/dL. Continue statin therapy.  Benign hypertension Office blood pressures are within acceptable range. Medications reconciled Low-salt diet recommended Currently managed by primary care provider.  Former smoker Educated importance of complete smoking cessation.  FINAL MEDICATION LIST END OF ENCOUNTER: No orders of the defined types were placed in this encounter.    Current Outpatient Medications:    amLODipine (NORVASC) 5 MG tablet, Take 5 mg by mouth daily., Disp: , Rfl:    aspirin EC 81 MG tablet,  Take 1 tablet (81 mg total) by mouth daily. Swallow whole., Disp: 90 tablet, Rfl: 3   atenolol (TENORMIN) 25 MG tablet, Take 25 mg by mouth 2 (two) times daily., Disp: , Rfl:    atorvastatin (LIPITOR) 20 MG tablet, TAKE 1 TABLET BY MOUTH EVERYDAY AT BEDTIME, Disp: 90 tablet, Rfl: 0   Calcium 250 MG CAPS, Take 500 mg by mouth 2 (two) times daily., Disp: , Rfl:    docusate sodium (COLACE) 100 MG capsule, 1 capsule as needed, Disp: , Rfl:    DULoxetine (CYMBALTA) 60 MG capsule, Take 60 mg by mouth daily., Disp: , Rfl: 4   lamoTRIgine (LAMICTAL) 200 MG tablet, Take 200 mg by mouth daily., Disp: , Rfl: 8   levothyroxine (SYNTHROID, LEVOTHROID) 100 MCG tablet, Take  50 mcg by mouth every morning. , Disp: , Rfl: 0   LORazepam (ATIVAN) 0.5 MG tablet, Take 0.5 mg by mouth 2 (two) times daily as needed., Disp: , Rfl:    losartan (COZAAR) 25 MG tablet, Take 25 mg by mouth daily., Disp: , Rfl:    MAGNESIUM PO, Take 1 tablet by mouth daily., Disp: , Rfl:    QUEtiapine (SEROQUEL) 200 MG tablet, Take 400-600 mg by mouth at bedtime., Disp: , Rfl: 12  Orders Placed This Encounter  Procedures   EKG 12-Lead    There are no Patient Instructions on file for this visit.  --Continue cardiac medications as reconciled in final medication list. --Return in about 16 months (around 06/09/2022) for Follow up, Coronary artery calcification, Lipid. Or sooner if needed. --Continue follow-up with your primary care physician regarding the management of your other chronic comorbid conditions.  Patient's questions and concerns were addressed to her satisfaction. She voices understanding of the instructions provided during this encounter.   This note was created using a voice recognition software as a result there may be grammatical errors inadvertently enclosed that do not reflect the nature of this encounter. Every attempt is made to correct such errors.  Rex Kras, [place], [facility]  Pager: [phone] Office: [phone]

## 2021-03-29 DIAGNOSIS — G2581 Restless legs syndrome: Secondary | ICD-10-CM | POA: Diagnosis not present

## 2021-03-29 DIAGNOSIS — E7800 Pure hypercholesterolemia, unspecified: Secondary | ICD-10-CM | POA: Diagnosis not present

## 2021-03-29 DIAGNOSIS — I10 Essential (primary) hypertension: Secondary | ICD-10-CM | POA: Diagnosis not present

## 2021-05-01 DIAGNOSIS — Z23 Encounter for immunization: Secondary | ICD-10-CM | POA: Diagnosis not present

## 2021-05-22 ENCOUNTER — Other Ambulatory Visit: Payer: Self-pay | Admitting: Cardiology

## 2021-05-22 DIAGNOSIS — I2510 Atherosclerotic heart disease of native coronary artery without angina pectoris: Secondary | ICD-10-CM

## 2021-05-22 DIAGNOSIS — I2584 Coronary atherosclerosis due to calcified coronary lesion: Secondary | ICD-10-CM

## 2021-06-21 DIAGNOSIS — E039 Hypothyroidism, unspecified: Secondary | ICD-10-CM | POA: Diagnosis not present

## 2021-06-21 DIAGNOSIS — I10 Essential (primary) hypertension: Secondary | ICD-10-CM | POA: Diagnosis not present

## 2021-06-25 DIAGNOSIS — Z23 Encounter for immunization: Secondary | ICD-10-CM | POA: Diagnosis not present

## 2021-06-26 DIAGNOSIS — J439 Emphysema, unspecified: Secondary | ICD-10-CM | POA: Diagnosis not present

## 2021-06-26 DIAGNOSIS — Z0000 Encounter for general adult medical examination without abnormal findings: Secondary | ICD-10-CM | POA: Diagnosis not present

## 2021-06-26 DIAGNOSIS — I10 Essential (primary) hypertension: Secondary | ICD-10-CM | POA: Diagnosis not present

## 2021-06-26 DIAGNOSIS — E871 Hypo-osmolality and hyponatremia: Secondary | ICD-10-CM | POA: Diagnosis not present

## 2021-06-26 DIAGNOSIS — I2510 Atherosclerotic heart disease of native coronary artery without angina pectoris: Secondary | ICD-10-CM | POA: Diagnosis not present

## 2021-06-26 DIAGNOSIS — E039 Hypothyroidism, unspecified: Secondary | ICD-10-CM | POA: Diagnosis not present

## 2021-06-26 DIAGNOSIS — M8580 Other specified disorders of bone density and structure, unspecified site: Secondary | ICD-10-CM | POA: Diagnosis not present

## 2021-06-26 DIAGNOSIS — F333 Major depressive disorder, recurrent, severe with psychotic symptoms: Secondary | ICD-10-CM | POA: Diagnosis not present

## 2021-06-28 DIAGNOSIS — E039 Hypothyroidism, unspecified: Secondary | ICD-10-CM | POA: Diagnosis not present

## 2021-06-28 DIAGNOSIS — I25111 Atherosclerotic heart disease of native coronary artery with angina pectoris with documented spasm: Secondary | ICD-10-CM | POA: Diagnosis not present

## 2021-06-28 DIAGNOSIS — I10 Essential (primary) hypertension: Secondary | ICD-10-CM | POA: Diagnosis not present

## 2021-06-28 DIAGNOSIS — E7800 Pure hypercholesterolemia, unspecified: Secondary | ICD-10-CM | POA: Diagnosis not present

## 2021-08-22 DIAGNOSIS — H52203 Unspecified astigmatism, bilateral: Secondary | ICD-10-CM | POA: Diagnosis not present

## 2021-08-22 DIAGNOSIS — H524 Presbyopia: Secondary | ICD-10-CM | POA: Diagnosis not present

## 2021-08-22 DIAGNOSIS — Z961 Presence of intraocular lens: Secondary | ICD-10-CM | POA: Diagnosis not present

## 2022-01-07 DIAGNOSIS — Z23 Encounter for immunization: Secondary | ICD-10-CM | POA: Diagnosis not present

## 2022-01-09 DIAGNOSIS — Z1231 Encounter for screening mammogram for malignant neoplasm of breast: Secondary | ICD-10-CM | POA: Diagnosis not present

## 2022-01-09 DIAGNOSIS — Z8262 Family history of osteoporosis: Secondary | ICD-10-CM | POA: Diagnosis not present

## 2022-01-09 DIAGNOSIS — Z01419 Encounter for gynecological examination (general) (routine) without abnormal findings: Secondary | ICD-10-CM | POA: Diagnosis not present

## 2022-01-09 DIAGNOSIS — E039 Hypothyroidism, unspecified: Secondary | ICD-10-CM | POA: Diagnosis not present

## 2022-01-09 DIAGNOSIS — M816 Localized osteoporosis [Lequesne]: Secondary | ICD-10-CM | POA: Diagnosis not present

## 2022-01-09 DIAGNOSIS — N958 Other specified menopausal and perimenopausal disorders: Secondary | ICD-10-CM | POA: Diagnosis not present

## 2022-01-09 DIAGNOSIS — Z6825 Body mass index (BMI) 25.0-25.9, adult: Secondary | ICD-10-CM | POA: Diagnosis not present

## 2022-02-18 ENCOUNTER — Ambulatory Visit: Payer: Self-pay

## 2022-02-18 NOTE — Patient Outreach (Signed)
  Care Coordination   02/18/2022 Name: Veronica Wood MRN: [id number] DOB: 1950/07/14   Care Coordination Outreach Attempts:  An unsuccessful telephone outreach was attempted today to offer the patient information about available care coordination services as a benefit of their health plan.   Follow Up Plan:  Additional outreach attempts will be made to offer the patient care coordination information and services.   Encounter Outcome:  No Answer  Care Coordination Interventions Activated:  No   Care Coordination Interventions:  No, not indicated    Juanell Fairly RN, BSN, [facility] Care Coordinator [facility]  Phone: [phone]

## 2022-02-21 ENCOUNTER — Ambulatory Visit: Payer: Self-pay

## 2022-02-21 NOTE — Patient Instructions (Signed)
Visit Information  Thank you for taking time to visit with me today. Please don't hesitate to contact me if I can be of assistance to you.   Following are the goals we discussed today:   Goals Addressed               This Visit's Progress     COMPLETED: Care Coordiantion Activities -no follow up required (pt-stated)        Care Coordination Interventions: Active listening / Reflection utilized  [employer] Program 2.  Annual Wellness Visit 3.  Discussed Social Determinates of Health        The patient verbalized understanding of instructions, educational materials, and care plan provided today and agreed to receive a mailed copy of patient instructions, educational materials, and care plan.    Juanell Fairly RN, BSN, [facility] Care Coordinator [facility]   Phone: [phone]

## 2022-02-21 NOTE — Patient Outreach (Signed)
  Care Coordination   Initial Visit Note   02/21/2022 Name: Veronica Wood MRN: [id number] DOB: 1950/12/17  Veronica Wood is a 71 y.o. year old female who sees Merri Brunette, MD for primary care. I spoke with  [name] by phone today.  What matters to the patients health and wellness today?  No needs or concerns    Goals Addressed               This Visit's Progress     COMPLETED: Care Coordiantion Activities -no follow up required (pt-stated)        Care Coordination Interventions: Active listening / Reflection utilized  [employer] Program 2.  Annual Wellness Visit 3.  Discussed Social Determinates of Health         SDOH assessments and interventions completed:  Yes  SDOH Interventions Today    Flowsheet Row Most Recent Value  SDOH Interventions   Food Insecurity Interventions Intervention Not Indicated  Transportation Interventions Intervention Not Indicated        Care Coordination Interventions Activated:  Yes  Care Coordination Interventions:  Yes, provided   Follow up plan: No further intervention required.   Encounter Outcome:  Pt. Visit Completed   Juanell Fairly RN, BSN, [facility] Care Coordinator [facility]   Phone: [phone]

## 2022-03-26 ENCOUNTER — Encounter (HOSPITAL_COMMUNITY): Payer: Self-pay | Admitting: Radiology

## 2022-03-26 ENCOUNTER — Other Ambulatory Visit: Payer: Self-pay

## 2022-03-26 ENCOUNTER — Emergency Department (HOSPITAL_COMMUNITY): Payer: Medicare Other

## 2022-03-26 ENCOUNTER — Inpatient Hospital Stay (HOSPITAL_COMMUNITY)
Admission: EM | Admit: 2022-03-26 | Discharge: 2022-04-03 | DRG: 640 | Disposition: A | Discharge status: Skilled Nursing Facility | Payer: Medicare Other | Attending: Internal Medicine | Admitting: Internal Medicine

## 2022-03-26 DIAGNOSIS — R29706 NIHSS score 6: Secondary | ICD-10-CM | POA: Diagnosis not present

## 2022-03-26 DIAGNOSIS — G319 Degenerative disease of nervous system, unspecified: Secondary | ICD-10-CM | POA: Diagnosis not present

## 2022-03-26 DIAGNOSIS — G9340 Encephalopathy, unspecified: Secondary | ICD-10-CM | POA: Diagnosis not present

## 2022-03-26 DIAGNOSIS — R471 Dysarthria and anarthria: Secondary | ICD-10-CM | POA: Diagnosis present

## 2022-03-26 DIAGNOSIS — I2510 Atherosclerotic heart disease of native coronary artery without angina pectoris: Secondary | ICD-10-CM

## 2022-03-26 DIAGNOSIS — E039 Hypothyroidism, unspecified: Secondary | ICD-10-CM

## 2022-03-26 DIAGNOSIS — R339 Retention of urine, unspecified: Secondary | ICD-10-CM | POA: Diagnosis present

## 2022-03-26 DIAGNOSIS — R41841 Cognitive communication deficit: Secondary | ICD-10-CM | POA: Diagnosis not present

## 2022-03-26 DIAGNOSIS — Z1152 Encounter for screening for COVID-19: Secondary | ICD-10-CM | POA: Diagnosis not present

## 2022-03-26 DIAGNOSIS — Z79890 Hormone replacement therapy (postmenopausal): Secondary | ICD-10-CM | POA: Diagnosis not present

## 2022-03-26 DIAGNOSIS — I10 Essential (primary) hypertension: Secondary | ICD-10-CM | POA: Diagnosis not present

## 2022-03-26 DIAGNOSIS — Z823 Family history of stroke: Secondary | ICD-10-CM

## 2022-03-26 DIAGNOSIS — R338 Other retention of urine: Secondary | ICD-10-CM

## 2022-03-26 DIAGNOSIS — W07XXXA Fall from chair, initial encounter: Secondary | ICD-10-CM | POA: Diagnosis present

## 2022-03-26 DIAGNOSIS — R400 Somnolence: Secondary | ICD-10-CM | POA: Diagnosis not present

## 2022-03-26 DIAGNOSIS — Y92019 Unspecified place in single-family (private) house as the place of occurrence of the external cause: Secondary | ICD-10-CM

## 2022-03-26 DIAGNOSIS — R251 Tremor, unspecified: Secondary | ICD-10-CM | POA: Diagnosis not present

## 2022-03-26 DIAGNOSIS — R2681 Unsteadiness on feet: Secondary | ICD-10-CM | POA: Diagnosis not present

## 2022-03-26 DIAGNOSIS — R4182 Altered mental status, unspecified: Secondary | ICD-10-CM | POA: Diagnosis not present

## 2022-03-26 DIAGNOSIS — M6281 Muscle weakness (generalized): Secondary | ICD-10-CM | POA: Diagnosis not present

## 2022-03-26 DIAGNOSIS — Z87891 Personal history of nicotine dependence: Secondary | ICD-10-CM

## 2022-03-26 DIAGNOSIS — N179 Acute kidney failure, unspecified: Secondary | ICD-10-CM | POA: Diagnosis present

## 2022-03-26 DIAGNOSIS — Z7401 Bed confinement status: Secondary | ICD-10-CM | POA: Diagnosis not present

## 2022-03-26 DIAGNOSIS — F32A Depression, unspecified: Secondary | ICD-10-CM | POA: Diagnosis present

## 2022-03-26 DIAGNOSIS — F419 Anxiety disorder, unspecified: Secondary | ICD-10-CM | POA: Diagnosis present

## 2022-03-26 DIAGNOSIS — E538 Deficiency of other specified B group vitamins: Secondary | ICD-10-CM | POA: Diagnosis present

## 2022-03-26 DIAGNOSIS — E785 Hyperlipidemia, unspecified: Secondary | ICD-10-CM | POA: Diagnosis not present

## 2022-03-26 DIAGNOSIS — Z79899 Other long term (current) drug therapy: Secondary | ICD-10-CM | POA: Diagnosis not present

## 2022-03-26 DIAGNOSIS — Z7982 Long term (current) use of aspirin: Secondary | ICD-10-CM

## 2022-03-26 DIAGNOSIS — R4587 Impulsiveness: Secondary | ICD-10-CM | POA: Diagnosis not present

## 2022-03-26 DIAGNOSIS — R29818 Other symptoms and signs involving the nervous system: Secondary | ICD-10-CM | POA: Diagnosis not present

## 2022-03-26 DIAGNOSIS — R059 Cough, unspecified: Secondary | ICD-10-CM | POA: Diagnosis not present

## 2022-03-26 DIAGNOSIS — Z888 Allergy status to other drugs, medicaments and biological substances status: Secondary | ICD-10-CM

## 2022-03-26 DIAGNOSIS — I2584 Coronary atherosclerosis due to calcified coronary lesion: Secondary | ICD-10-CM | POA: Diagnosis not present

## 2022-03-26 DIAGNOSIS — G9341 Metabolic encephalopathy: Secondary | ICD-10-CM | POA: Diagnosis present

## 2022-03-26 DIAGNOSIS — R531 Weakness: Secondary | ICD-10-CM | POA: Diagnosis not present

## 2022-03-26 DIAGNOSIS — R2689 Other abnormalities of gait and mobility: Secondary | ICD-10-CM | POA: Diagnosis not present

## 2022-03-26 LAB — RESP PANEL BY RT-PCR (FLU A&B, COVID) ARPGX2
Influenza A by PCR: NEGATIVE
Influenza B by PCR: NEGATIVE
SARS Coronavirus 2 by RT PCR: NEGATIVE

## 2022-03-26 LAB — URINALYSIS, ROUTINE W REFLEX MICROSCOPIC
Bacteria, UA: NONE SEEN
Bilirubin Urine: NEGATIVE
Glucose, UA: NEGATIVE mg/dL
Ketones, ur: NEGATIVE mg/dL
Leukocytes,Ua: NEGATIVE
Nitrite: NEGATIVE
Protein, ur: NEGATIVE mg/dL
Specific Gravity, Urine: 1.012 (ref 1.005–1.030)
pH: 6 (ref 5.0–8.0)

## 2022-03-26 LAB — ETHANOL: Alcohol, Ethyl (B): <10 mg/dL (ref <10)

## 2022-03-26 LAB — DIFFERENTIAL
Abs Immature Granulocytes: 0.05 10*3/uL (ref 0.00–0.07)
Basophils Absolute: 0 10*3/uL (ref 0.0–0.1)
Basophils Relative: 0 %
Eosinophils Absolute: 0.1 10*3/uL (ref 0.0–0.5)
Eosinophils Relative: 1 %
Immature Granulocytes: 0 %
Lymphocytes Relative: 15 %
Lymphs Abs: 1.7 10*3/uL (ref 0.7–4.0)
Monocytes Absolute: 1 10*3/uL (ref 0.1–1.0)
Monocytes Relative: 9 %
Neutro Abs: 8.4 10*3/uL — ABNORMAL HIGH (ref 1.7–7.7)
Neutrophils Relative %: 75 %

## 2022-03-26 LAB — COMPREHENSIVE METABOLIC PANEL
ALT: 27 U/L (ref 0–44)
AST: 46 U/L — ABNORMAL HIGH (ref 15–41)
Albumin: 4.2 g/dL (ref 3.5–5.0)
Alkaline Phosphatase: 64 U/L (ref 38–126)
Anion gap: 8 (ref 5–15)
BUN: 18 mg/dL (ref 8–23)
CO2: 24 mmol/L (ref 22–32)
Calcium: 9.3 mg/dL (ref 8.9–10.3)
Chloride: 104 mmol/L (ref 98–111)
Creatinine, Ser: 1.2 mg/dL — ABNORMAL HIGH (ref 0.44–1.00)
GFR, Estimated: 49 mL/min — ABNORMAL LOW (ref >60)
Glucose, Bld: 132 mg/dL — ABNORMAL HIGH (ref 70–99)
Potassium: 3.3 mmol/L — ABNORMAL LOW (ref 3.5–5.1)
Sodium: 136 mmol/L (ref 135–145)
Total Bilirubin: 1 mg/dL (ref 0.3–1.2)
Total Protein: 7.2 g/dL (ref 6.5–8.1)

## 2022-03-26 LAB — I-STAT CHEM 8, ED
BUN: 25 mg/dL — ABNORMAL HIGH (ref 8–23)
Calcium, Ion: 1 mmol/L — ABNORMAL LOW (ref 1.15–1.40)
Chloride: 103 mmol/L (ref 98–111)
Creatinine, Ser: 1.2 mg/dL — ABNORMAL HIGH (ref 0.44–1.00)
Glucose, Bld: 127 mg/dL — ABNORMAL HIGH (ref 70–99)
HCT: 39 % (ref 36.0–46.0)
Hemoglobin: 13.3 g/dL (ref 12.0–15.0)
Potassium: 7.6 mmol/L (ref 3.5–5.1)
Sodium: 132 mmol/L — ABNORMAL LOW (ref 135–145)
TCO2: 25 mmol/L (ref 22–32)

## 2022-03-26 LAB — CBC
HCT: 40.3 % (ref 36.0–46.0)
Hemoglobin: 13.5 g/dL (ref 12.0–15.0)
MCH: 33.2 pg (ref 26.0–34.0)
MCHC: 33.5 g/dL (ref 30.0–36.0)
MCV: 99 fL (ref 80.0–100.0)
Platelets: 281 10*3/uL (ref 150–400)
RBC: 4.07 MIL/uL (ref 3.87–5.11)
RDW: 13.7 % (ref 11.5–15.5)
WBC: 11.3 10*3/uL — ABNORMAL HIGH (ref 4.0–10.5)
nRBC: 0 % (ref 0.0–0.2)

## 2022-03-26 LAB — RAPID URINE DRUG SCREEN, HOSP PERFORMED
Amphetamines: NOT DETECTED
Barbiturates: NOT DETECTED
Benzodiazepines: NOT DETECTED
Cocaine: NOT DETECTED
Opiates: NOT DETECTED
Tetrahydrocannabinol: NOT DETECTED

## 2022-03-26 LAB — VITAMIN B12: Vitamin B-12: 181 pg/mL (ref 180–914)

## 2022-03-26 LAB — PROTIME-INR
INR: 0.9 (ref 0.8–1.2)
Prothrombin Time: 12.5 seconds (ref 11.4–15.2)

## 2022-03-26 LAB — CBG MONITORING, ED: Glucose-Capillary: 98 mg/dL (ref 70–99)

## 2022-03-26 LAB — FOLATE: Folate: 5.6 ng/mL — ABNORMAL LOW (ref >5.9)

## 2022-03-26 LAB — TSH: TSH: 0.665 u[IU]/mL (ref 0.350–4.500)

## 2022-03-26 LAB — APTT: aPTT: 25 seconds (ref 24–36)

## 2022-03-26 LAB — AMMONIA: Ammonia: 16 umol/L (ref 9–35)

## 2022-03-26 MED ORDER — ATENOLOL 25 MG PO TABS
25.0000 mg | ORAL_TABLET | Freq: Two times a day (BID) | ORAL | Status: DC
  Administered 2022-03-27 – 2022-04-03 (×16): 25 mg via ORAL
  Filled 2022-03-26 (×18): qty 1

## 2022-03-26 MED ORDER — SODIUM CHLORIDE 0.9% FLUSH
3.0000 mL | Freq: Two times a day (BID) | INTRAVENOUS | Status: DC
  Administered 2022-03-27 – 2022-04-02 (×15): 3 mL via INTRAVENOUS

## 2022-03-26 MED ORDER — ACETAMINOPHEN 325 MG PO TABS
650.0000 mg | ORAL_TABLET | Freq: Four times a day (QID) | ORAL | Status: DC | PRN
  Administered 2022-03-29 – 2022-03-30 (×2): 650 mg via ORAL
  Filled 2022-03-26 (×3): qty 2

## 2022-03-26 MED ORDER — SODIUM CHLORIDE 0.9 % IV SOLN: INTRAVENOUS | Status: DC

## 2022-03-26 MED ORDER — LORAZEPAM 0.5 MG PO TABS
0.5000 mg | ORAL_TABLET | Freq: Two times a day (BID) | ORAL | Status: DC
  Administered 2022-03-27 – 2022-04-03 (×14): 0.5 mg via ORAL
  Filled 2022-03-26 (×15): qty 1

## 2022-03-26 MED ORDER — AMLODIPINE BESYLATE 5 MG PO TABS
5.0000 mg | ORAL_TABLET | Freq: Every day | ORAL | Status: DC
  Administered 2022-03-27 – 2022-04-03 (×8): 5 mg via ORAL
  Filled 2022-03-26 (×8): qty 1

## 2022-03-26 MED ORDER — ENOXAPARIN SODIUM 40 MG/0.4ML IJ SOSY
40.0000 mg | PREFILLED_SYRINGE | INTRAMUSCULAR | Status: DC
  Administered 2022-03-27 – 2022-04-03 (×8): 40 mg via SUBCUTANEOUS
  Filled 2022-03-26 (×8): qty 0.4

## 2022-03-26 MED ORDER — LOSARTAN POTASSIUM 25 MG PO TABS
25.0000 mg | ORAL_TABLET | Freq: Every day | ORAL | Status: DC
  Administered 2022-03-27 – 2022-04-03 (×8): 25 mg via ORAL
  Filled 2022-03-26 (×8): qty 1

## 2022-03-26 MED ORDER — THIAMINE MONONITRATE 100 MG PO TABS
100.0000 mg | ORAL_TABLET | Freq: Every day | ORAL | Status: DC
  Administered 2022-03-26 – 2022-04-03 (×9): 100 mg via ORAL
  Filled 2022-03-26 (×10): qty 1

## 2022-03-26 MED ORDER — ACETAMINOPHEN 650 MG RE SUPP: 650.0000 mg | Freq: Four times a day (QID) | RECTAL | Status: DC | PRN

## 2022-03-26 MED ORDER — LEVOTHYROXINE SODIUM 50 MCG PO TABS: 50.0000 ug | ORAL_TABLET | ORAL | Status: DC

## 2022-03-26 NOTE — Progress Notes (Signed)
Page to [facility] neuro at this time  Telestroke RN

## 2022-03-26 NOTE — Assessment & Plan Note (Signed)
-   baseline creatinine ~ 0.8 - patient presents with increase in creat >0.3 mg/dL above baseline, creat increase >1.5x baseline presumed to have occurred within past 7 days PTA -Suspected prerenal for now vs due to urinary retention  - some improvement with fluids but still retaining urine; see urinary retention -Continue diet.  Okay to stop fluids.  Continue Foley

## 2022-03-26 NOTE — ED Notes (Signed)
PA went in room to perform MSE and patient was slurring words and having aphasia. Pt was A&O x4 on arrival with EMS with drowsiness and slow to respond. No slurring and aphasia noted on arrival. Code stroke called and patient to [place]. Attempted to called husband and VM left.

## 2022-03-26 NOTE — ED Triage Notes (Signed)
Pt reports fall after feeling drowsy and sliding out of chair. Pt reports starting new psych meds Reports burning with urination and retention x8 days Denies blood thinner usage.  Denies hitting head.  Cbg with ems 102

## 2022-03-26 NOTE — Progress Notes (Signed)
Telestroke cart activated at this time. Pt had been at [facility] waiting in waiting area w/UTI sx, had also started taking a new psych med but unsure of what it is.  While PA Jenny Reichmann was examining pt at 1555, pt had transient episode of aphasia, staring off not answering or responding appropriately.

## 2022-03-26 NOTE — Assessment & Plan Note (Signed)
-  Hold statin for now 

## 2022-03-26 NOTE — Assessment & Plan Note (Signed)
-   evaluated outpatient by cardiology, last seen by Dr. Terri Skains on 02/21/2021 -Patient recommended to continue on aspirin and statin

## 2022-03-26 NOTE — H&P (Signed)
History and Physical    Veronica Wood  PZW:[id number]  DOB: 1950-10-30  DOA: 03/26/2022  PCP: Merri Brunette, MD Patient coming from: home  Chief Complaint: confusion  HPI:  Veronica Wood is a 71 yo female with PMH anxiety, HTN, HLD, depression, thyroid disease who was brought into the hospital due to confusion, feeling drowsy, and falling out of chair at home.  She also endorsed urinary retention and burning for several days prior to admission.  She had some report of dysarthria during work-up and code stroke was activated.  CT head was negative for acute changes.  CT head did show progression of underlying cerebral atrophy and chronic white matter changes since 2015. She was recently started on amantadine on 02/24/2022 for a 30-day supply for trial of suppressing tremors (no significant improvement per husband).  No other notable changes in medications in terms of new meds recently.  UDS was negative.  Urinalysis negative for signs of infection.  TSH normal, ammonia negative. She was admitted for further encephalopathy work-up.  I have personally briefly reviewed patient's old medical records in [facility] and discussed patient with the ER provider when appropriate/indicated.  Assessment and Plan: * Acute metabolic encephalopathy - apparent acute presentation; per husband her baseline  - normal TSH and NH3 - negative UDS and UA - differential includes medication side effect (possible from amantadine? vs recent trial of inc dose of seroquel) vs conversion vs underlying significant cerebral atrophy (dementia) vs vitamin/nutrient deficiency - follow up neuro recommendations; hold off on MRI brain for now - check folate, B12, B1 level - start thiamine  - hold non-essential meds - start IVF - bedside swallow eval, if passes, can have diet    AKI (acute kidney injury) (HCC) - baseline creatinine ~ 0.8 - patient presents with increase in creat >0.3 mg/dL above baseline, creat  increase >1.5x baseline presumed to have occurred within past 7 days PTA -Suspected prerenal for now vs due to urinary retention  - Start fluids and continue bladder scans (insert foley if PVR > 350 cc) - BMP in a.m.   Hypothyroidism - TSH normal, 0.665 - Continue Synthroid  Hypertension - Continue amlodipine, atenolol, losartan  Hyperlipidemia - Hold statin for now  Coronary atherosclerosis due to calcified coronary lesion - evaluated outpatient by cardiology, last seen by Dr. Odis Hollingshead on 02/21/2021 -Patient recommended to continue on aspirin and statin   Code Status: Full  DVT Prophylaxis: Lovenox     Anticipated disposition is to: Home  History: Past Medical History:  Diagnosis Date   Anxiety    Coronary atherosclerosis due to calcified coronary lesion    Depression    Hyperlipidemia    Hypertension    Thyroid disease     Past Surgical History:  Procedure Laterality Date   COLONOSCOPY     DILATION AND CURETTAGE OF UTERUS       reports that she quit smoking about 36 years ago. Her smoking use included cigarettes. She has a 40.00 pack-year smoking history. She has never used smokeless tobacco. She reports current alcohol use of about 1.0 standard drink of alcohol per week. She reports that she does not use drugs.  Allergies  Allergen Reactions   Celexa [Citalopram Hydrobromide] Rash    Family History  Problem Relation Age of Onset   Stroke Mother    Pulmonary embolism Father    Home Medications: Prior to Admission medications   Medication Sig Start Date End Date Taking? Authorizing Provider  amLODipine (NORVASC)  5 MG tablet Take 5 mg by mouth daily. 05/26/20   [provider]  aspirin EC 81 MG tablet Take 1 tablet (81 mg total) by mouth daily. Swallow whole. 07/27/20   Tolia, Sunit, DO  atenolol (TENORMIN) 25 MG tablet Take 25 mg by mouth 2 (two) times daily. 07/09/20   [provider]  atorvastatin (LIPITOR) 20 MG tablet TAKE 1 TABLET BY  MOUTH EVERYDAY AT BEDTIME 05/22/21   Tolia, Sunit, DO  Calcium 250 MG CAPS Take 500 mg by mouth 2 (two) times daily.    [provider]  docusate sodium (COLACE) 100 MG capsule 1 capsule as needed    [provider]  DULoxetine (CYMBALTA) 60 MG capsule Take 60 mg by mouth daily. 01/21/16   [provider]  lamoTRIgine (LAMICTAL) 200 MG tablet Take 200 mg by mouth daily. 03/18/16   [provider]  levothyroxine (SYNTHROID, LEVOTHROID) 100 MCG tablet Take 50 mcg by mouth every morning.  02/20/16   [provider]  LORazepam (ATIVAN) 0.5 MG tablet Take 0.5 mg by mouth 2 (two) times daily as needed. 05/25/20   [provider]  losartan (COZAAR) 25 MG tablet Take 25 mg by mouth daily. 08/20/20   [provider]  MAGNESIUM PO Take 1 tablet by mouth daily.    [provider]  QUEtiapine (SEROQUEL) 200 MG tablet Take 400-600 mg by mouth at bedtime. 02/20/16   [provider]    Review of Systems:  Review of Systems  Unable to perform ROS: Mental status change    Physical Exam:  Vitals:   03/26/22 1545 03/26/22 1600 03/26/22 1615 03/26/22 1630  BP: (!) 150/90 136/78 136/81 (!) 141/80  Pulse: (!) 101 97 100 96  Resp: 17 (!) 28 (!) 21 17  Temp:    97.8 F (36.6 C)  TempSrc:    Oral  SpO2: 100% 100% 100% 99%  Weight:      Height:       Physical Exam Constitutional:      General: She is not in acute distress.    Appearance: She is not ill-appearing.     Comments: Obvious confusion   HENT:     Head: Normocephalic and atraumatic.     Mouth/Throat:     Mouth: Mucous membranes are moist.  Eyes:     Extraocular Movements: Extraocular movements intact.  Cardiovascular:     Rate and Rhythm: Normal rate and regular rhythm.  Pulmonary:     Effort: Pulmonary effort is normal.     Breath sounds: Normal breath sounds.  Abdominal:     General: Bowel sounds are normal. There is no distension.     Palpations: Abdomen is  soft.     Tenderness: There is no abdominal tenderness.  Musculoskeletal:        General: Normal range of motion.     Cervical back: Normal range of motion and neck supple.  Skin:    General: Skin is warm and dry.  Neurological:     Comments: Oriented to name, president.  Follows commands and moves all 4 extremities.  Resting and intentional tremor appreciated      Labs on Admission:  I have personally reviewed following labs and imaging studies Results for orders placed or performed during the hospital encounter of 03/26/22 (from the past 24 hour(s))  Resp Panel by RT-PCR (Flu A&B, Covid) Urine, In & Out Cath     Status: None   Collection Time: 03/26/22  1:57  PM   Specimen: Urine, In & Out Cath; Nasal Swab  Result Value Ref Range   SARS Coronavirus 2 by RT PCR NEGATIVE NEGATIVE   Influenza A by PCR NEGATIVE NEGATIVE   Influenza B by PCR NEGATIVE NEGATIVE  Ethanol     Status: None   Collection Time: 03/26/22  1:57 PM  Result Value Ref Range   Alcohol, Ethyl (B) <10 <10 mg/dL  Protime-INR     Status: None   Collection Time: 03/26/22  1:57 PM  Result Value Ref Range   Prothrombin Time 12.5 11.4 - 15.2 seconds   INR 0.9 0.8 - 1.2  APTT     Status: None   Collection Time: 03/26/22  1:57 PM  Result Value Ref Range   aPTT 25 24 - 36 seconds  CBC     Status: Abnormal   Collection Time: 03/26/22  1:57 PM  Result Value Ref Range   WBC 11.3 (H) 4.0 - 10.5 K/uL   RBC 4.07 3.87 - 5.11 MIL/uL   Hemoglobin 13.5 12.0 - 15.0 g/dL   HCT 76.7 34.1 - 93.7 %   MCV 99.0 80.0 - 100.0 fL   MCH 33.2 26.0 - 34.0 pg   MCHC 33.5 30.0 - 36.0 g/dL   RDW 90.2 40.9 - 73.5 %   Platelets 281 150 - 400 K/uL   nRBC 0.0 0.0 - 0.2 %  Differential     Status: Abnormal   Collection Time: 03/26/22  1:57 PM  Result Value Ref Range   Neutrophils Relative % 75 %   Neutro Abs 8.4 (H) 1.7 - 7.7 K/uL   Lymphocytes Relative 15 %   Lymphs Abs 1.7 0.7 - 4.0 K/uL   Monocytes Relative 9 %   Monocytes Absolute  1.0 0.1 - 1.0 K/uL   Eosinophils Relative 1 %   Eosinophils Absolute 0.1 0.0 - 0.5 K/uL   Basophils Relative 0 %   Basophils Absolute 0.0 0.0 - 0.1 K/uL   Immature Granulocytes 0 %   Abs Immature Granulocytes 0.05 0.00 - 0.07 K/uL  Comprehensive metabolic panel     Status: Abnormal   Collection Time: 03/26/22  1:57 PM  Result Value Ref Range   Sodium 136 135 - 145 mmol/L   Potassium 3.3 (L) 3.5 - 5.1 mmol/L   Chloride 104 98 - 111 mmol/L   CO2 24 22 - 32 mmol/L   Glucose, Bld 132 (H) 70 - 99 mg/dL   BUN 18 8 - 23 mg/dL   Creatinine, Ser 3.29 (H) 0.44 - 1.00 mg/dL   Calcium 9.3 8.9 - 92.4 mg/dL   Total Protein 7.2 6.5 - 8.1 g/dL   Albumin 4.2 3.5 - 5.0 g/dL   AST 46 (H) 15 - 41 U/L   ALT 27 0 - 44 U/L   Alkaline Phosphatase 64 38 - 126 U/L   Total Bilirubin 1.0 0.3 - 1.2 mg/dL   GFR, Estimated 49 (L) >60 mL/min   Anion gap 8 5 - 15  Urine rapid drug screen (hosp performed)     Status: None   Collection Time: 03/26/22  1:57 PM  Result Value Ref Range   Opiates NONE DETECTED NONE DETECTED   Cocaine NONE DETECTED NONE DETECTED   Benzodiazepines NONE DETECTED NONE DETECTED   Amphetamines NONE DETECTED NONE DETECTED   Tetrahydrocannabinol NONE DETECTED NONE DETECTED   Barbiturates NONE DETECTED NONE DETECTED  Urinalysis, Routine w reflex microscopic Urine, In & Out Cath     Status: Abnormal  Collection Time: 03/26/22  1:57 PM  Result Value Ref Range   Color, Urine YELLOW YELLOW   APPearance CLEAR CLEAR   Specific Gravity, Urine 1.012 1.005 - 1.030   pH 6.0 5.0 - 8.0   Glucose, UA NEGATIVE NEGATIVE mg/dL   Hgb urine dipstick SMALL (A) NEGATIVE   Bilirubin Urine NEGATIVE NEGATIVE   Ketones, ur NEGATIVE NEGATIVE mg/dL   Protein, ur NEGATIVE NEGATIVE mg/dL   Nitrite NEGATIVE NEGATIVE   Leukocytes,Ua NEGATIVE NEGATIVE   RBC / HPF 0-5 0 - 5 RBC/hpf   WBC, UA 0-5 0 - 5 WBC/hpf   Bacteria, UA NONE SEEN NONE SEEN  CBG monitoring, ED     Status: None   Collection Time: 03/26/22   1:59 PM  Result Value Ref Range   Glucose-Capillary 98 70 - 99 mg/dL  I-stat chem 8, ED     Status: Abnormal   Collection Time: 03/26/22  2:20 PM  Result Value Ref Range   Sodium 132 (L) 135 - 145 mmol/L   Potassium 7.6 (HH) 3.5 - 5.1 mmol/L   Chloride 103 98 - 111 mmol/L   BUN 25 (H) 8 - 23 mg/dL   Creatinine, Ser 0.86 (H) 0.44 - 1.00 mg/dL   Glucose, Bld 578 (H) 70 - 99 mg/dL   Calcium, Ion 4.69 (L) 1.15 - 1.40 mmol/L   TCO2 25 22 - 32 mmol/L   Hemoglobin 13.3 12.0 - 15.0 g/dL   HCT 62.9 52.8 - 41.3 %   Comment NOTIFIED PHYSICIAN   TSH     Status: None   Collection Time: 03/26/22  3:30 PM  Result Value Ref Range   TSH 0.665 0.350 - 4.500 uIU/mL  Ammonia     Status: None   Collection Time: 03/26/22  3:31 PM  Result Value Ref Range   Ammonia 16 9 - 35 umol/L     Radiological Exams on Admission: DG Chest 1 View  Result Date: 03/26/2022 CLINICAL DATA:  Cough.  Dysuria.  Drowsiness. EXAM: CHEST  1 VIEW COMPARISON:  None Available. FINDINGS: Trachea is midline. Heart size normal. Lungs are clear. No pleural fluid. IMPRESSION: No acute findings. Electronically Signed   By: Leanna Battles M.D.   On: 03/26/2022 15:59   CT HEAD CODE STROKE WO CONTRAST  Result Date: 03/26/2022 CLINICAL DATA:  Code stroke. Acute neuro deficit. Aphasia rule out stroke EXAM: CT HEAD WITHOUT CONTRAST TECHNIQUE: Contiguous axial images were obtained from the base of the skull through the vertex without intravenous contrast. RADIATION DOSE REDUCTION: This exam was performed according to the departmental dose-optimization program which includes automated exposure control, adjustment of the mA and/or kV according to patient size and/or use of iterative reconstruction technique. COMPARISON:  CT head 07/06/2013 FINDINGS: Brain: Generalized atrophy has progressed. Mild ventricular enlargement consistent with the degree of atrophy. Periventricular white matter hypodensity bilaterally has progressed in the interval.  Negative for acute infarct, hemorrhage, mass Vascular: Negative for hyperdense vessel Skull: Negative Sinuses/Orbits: Paranasal sinuses clear. Bilateral cataract extraction Other: None ASPECTS (Alberta Stroke Program Early CT Score) - Ganglionic level infarction (caudate, lentiform nuclei, internal capsule, insula, M1-M3 cortex): 7 - Supraganglionic infarction (M4-M6 cortex): 3 Total score (0-10 with 10 being normal): 10 IMPRESSION: 1. No acute abnormality 2. ASPECTS is 10 3. Progression of atrophy and chronic white matter changes since 2015 4. These results were called by telephone at the time of interpretation on 03/26/2022 at 2:19 pm to provider Ridgeley, who verbally acknowledged these results. Electronically Signed  By: Marlan Palauharles  Clark M.D.   On: 03/26/2022 14:19    DG Chest 1 View  Final Result    CT HEAD CODE STROKE WO CONTRAST  Final Result      Consults called:     EKG: pending    Lewie Chamberavid Madelynne Lasker, MD [facility] 03/26/2022, 5:29 PM

## 2022-03-26 NOTE — Consult Note (Signed)
NEUROLOGY TELECONSULTATION NOTE   Date of service: March 26, 2022 Patient Name: Veronica Wood MRN:  [id number] DOB:  [birth year] Reason for consult: stroke code for dysarthria  Requesting Provider: Dr. Angus Seller Consult Participants: myself, patient, bedside RN Location of the provider: [facility] Location of the patient: [facility]  This consult was provided via telemedicine with 2-way video and audio communication. The patient/family was informed that care would be provided in this way and agreed to receive care in this manner.   _ _ _   _ __   _ __ _ _  __ __   _ __   __ _  History of Present Illness   This is a 71 yo woman with hx CAD, HL, HTN who presented to the ED with pain with urination for one week. While in the waiting room she slid out of her chair and was noted to be confused. When evaluated by triage she was noted to have dysarthria and mild WFD. Unknown LKW. NIHSS = 6. Head CT NAICP. TNK not administered 2/2 unknown LKW and nonfocal exam concerning more for encephalopathy than for stroke. Exam not c/w LVO therefore vascular imaging was not performed as part of the stroke code and no intervention was indicated.   ROS   Per HPI; all other systems reviewed and are negative  Past History   The following was personally reviewed:  Past Medical History:  Diagnosis Date   Anxiety    Coronary atherosclerosis due to calcified coronary lesion    Depression    Hyperlipidemia    Hypertension    Thyroid disease    Past Surgical History:  Procedure Laterality Date   COLONOSCOPY     DILATION AND CURETTAGE OF UTERUS     Family History  Problem Relation Age of Onset   Stroke Mother    Pulmonary embolism Father    Social History   Socioeconomic History   Marital status: Married    Spouse name: Not on file   Number of children: 0   Years of education: Not on file   Highest education level: Not on file  Occupational History   Not on file  Tobacco Use   Smoking status:  Former    Packs/day: 2.00    Years: 20.00    Total pack years: 40.00    Types: Cigarettes    Quit date: 07/27/1985    Years since quitting: 36.6   Smokeless tobacco: Never  Vaping Use   Vaping Use: Never used  Substance and Sexual Activity   Alcohol use: Yes    Alcohol/week: 1.0 standard drink of alcohol    Types: 1 Shots of liquor per week    Comment: Occasionally in the evening   Drug use: Never   Sexual activity: Not on file  Other Topics Concern   Not on file  Social History Narrative   Not on file   Social Determinants of Health   Financial Resource Strain: Not on file  Food Insecurity: Not on file  Transportation Needs: Not on file  Physical Activity: Not on file  Stress: Not on file  Social Connections: Not on file   Allergies  Allergen Reactions   Celexa [Citalopram Hydrobromide] Rash    Medications   (Not in a hospital admission)    No current facility-administered medications for this encounter.  Current Outpatient Medications:    amLODipine (NORVASC) 5 MG tablet, Take 5 mg by mouth daily., Disp: , Rfl:    aspirin EC 81  MG tablet, Take 1 tablet (81 mg total) by mouth daily. Swallow whole., Disp: 90 tablet, Rfl: 3   atenolol (TENORMIN) 25 MG tablet, Take 25 mg by mouth 2 (two) times daily., Disp: , Rfl:    atorvastatin (LIPITOR) 20 MG tablet, TAKE 1 TABLET BY MOUTH EVERYDAY AT BEDTIME, Disp: 90 tablet, Rfl: 0   Calcium 250 MG CAPS, Take 500 mg by mouth 2 (two) times daily., Disp: , Rfl:    docusate sodium (COLACE) 100 MG capsule, 1 capsule as needed, Disp: , Rfl:    DULoxetine (CYMBALTA) 60 MG capsule, Take 60 mg by mouth daily., Disp: , Rfl: 4   lamoTRIgine (LAMICTAL) 200 MG tablet, Take 200 mg by mouth daily., Disp: , Rfl: 8   levothyroxine (SYNTHROID, LEVOTHROID) 100 MCG tablet, Take 50 mcg by mouth every morning. , Disp: , Rfl: 0   LORazepam (ATIVAN) 0.5 MG tablet, Take 0.5 mg by mouth 2 (two) times daily as needed., Disp: , Rfl:    losartan (COZAAR)  25 MG tablet, Take 25 mg by mouth daily., Disp: , Rfl:    MAGNESIUM PO, Take 1 tablet by mouth daily., Disp: , Rfl:    QUEtiapine (SEROQUEL) 200 MG tablet, Take 400-600 mg by mouth at bedtime., Disp: , Rfl: 12  Vitals   Vitals:   03/26/22 1310 03/26/22 1312 03/26/22 1405  BP: 124/65  (!) 154/82  Pulse: (!) 108  (!) 101  Resp: 16  18  Temp: 98.4 F (36.9 C)    TempSrc: Oral    SpO2: 95%  100%  Weight:  72 kg   Height:  5' 6"  (1.676 m)      Body mass index is 25.62 kg/m.  Physical Exam   Exam performed over telemedicine with 2-way video and audio communication and with assistance of bedside RN  Physical Exam Gen: Awake but lethargic, oriented to self and hospital but not month or age. NAD Resp: normal WOB CV: extremities appear well-perfused  Neuro: *MS: Awake but lethargic, oriented to self and hospital but not month or age. Follows most simple commands. *Speech: mild dysarthria, able to name 5/6 objects and repeat *CN: PERRL 20m, EOMI, blinks to threat bilat, sensation intact, smile symmetric, hearing intact to voice *Motor:   Normal bulk.  No tremor, rigidity or bradykinesia. All extremities appear equally full-strength except mild drift with bobbing in LLE. *Sensory: SILT. Symmetric. No double-simultaneous extinction.  *Coordination:  FNF bradykinetic bilat without frank ataxia *Reflexes:  UTA 2/2 tele-exam *Gait: deferred  NIHSS  1a Level of Conscious.: 1 1b LOC Questions: 2 1c LOC Commands: 1 2 Best Gaze: 0 3 Visual: 0 4 Facial Palsy: 0 5a Motor Arm - left: 0 5b Motor Arm - Right: 0 6a Motor Leg - Left: 1 6b Motor Leg - Right: 0 7 Limb Ataxia: 0 8 Sensory: 0 9 Best Language: 0 10 Dysarthria: 1 11 Extinct. and Inatten.: 0  TOTAL: 6   Premorbid mRS = unknown, likely 2   Labs   CBC:  Recent Labs  Lab 03/26/22 1357  WBC 11.3*  NEUTROABS 8.4*  HGB 13.5  HCT 40.3  MCV 99.0  PLT 2562   Basic Metabolic Panel:  Lab Results  Component Value  Date   NA 136 08/28/2020   K 4.7 08/28/2020   CO2 23 03/06/2010   GLUCOSE 95 08/28/2020   BUN 8 08/28/2020   CREATININE 0.76 08/28/2020   CALCIUM 9.9 08/28/2020   GFRNONAA 56 (L) 03/06/2010   GFRAA  03/06/2010    >60        The eGFR has been calculated using the MDRD equation. This calculation has not been validated in all clinical situations. eGFR's persistently <60 mL/min signify possible Chronic Kidney Disease.   Lipid Panel:  Lab Results  Component Value Date   LDLCALC 92 08/28/2020   HgbA1c: No results found for: "HGBA1C" Urine Drug Screen:     Component Value Date/Time   LABOPIA NONE DETECTED 03/06/2010 2130   COCAINSCRNUR NONE DETECTED 03/06/2010 2130   LABBENZ NONE DETECTED 03/06/2010 2130   AMPHETMU NONE DETECTED 03/06/2010 2130   THCU NONE DETECTED 03/06/2010 2130   LABBARB  03/06/2010 2130    NONE DETECTED        DRUG SCREEN FOR MEDICAL PURPOSES ONLY.  IF CONFIRMATION IS NEEDED FOR ANY PURPOSE, NOTIFY LAB WITHIN 5 DAYS.        LOWEST DETECTABLE LIMITS FOR URINE DRUG SCREEN Drug Class       Cutoff (ng/mL) Amphetamine      1000 Barbiturate      200 Benzodiazepine   341 Tricyclics       962 Opiates          300 Cocaine          300 THC              50    Alcohol Level     Component Value Date/Time   [facility]  03/06/2010 2056    <5        LOWEST DETECTABLE LIMIT FOR SERUM ALCOHOL IS 5 mg/dL FOR MEDICAL PURPOSES ONLY     Impression   This is a 71 yo woman with hx CAD, HL, HTN who presented to the ED with pain with urination for one week and was noted to be encephalopathic in triage. LKW unknown. UTI neg for infection. Neurologic exam more c/w encephalopathy than focal vascular insult. Etiology favored to be metabolic vs medication effect (recently started amantadine and increased dose of seroquel).   Recommendations   - Given low suspicion for ischemic stroke recommend admission to medicine service for encephalopathy workup and no further stroke  workup at this time. If encephalopathy does not improve after metabolic derangements addressed and medications optimized, consider MRI brain at that time. - Continue home ASA 13m daily - Workup and mgmt encephalopathy per admitting medicine team - If encephalopathy does not improve or new neurologic concerns arise please place routine teleconsult [facility]-4pm M-F.   ______________________________________________________________________   Thank you for the opportunity to take part in the care of this patient. If you have any further questions, please contact the neurology consultation attending.  Signed,  CSu Monks MD [facility] [phone] If 7pm- 7am, please page neurology on call as listed in [facility]

## 2022-03-26 NOTE — ED Provider Triage Note (Signed)
Emergency Medicine Provider Triage Evaluation Note  Veronica Wood , a 71 y.o. female  was evaluated in triage.  Pt complains of fall. Unable to ascertain recent history. Patient having trouble producing speech and is disoriented. Nurse states this is a mental status change as her speech and mentation was appropriate when she arrived at 1300. I evaluated her at 13:55.    Review of Systems  Positive:  Negative:   Physical Exam  BP 124/65   Pulse (!) 108   Temp 98.4 F (36.9 C) (Oral)   Resp 16   Ht 5\' 6"  (1.676 m)   Wt 72 kg   SpO2 95%   BMI 25.62 kg/m  Gen:   Awake, no distress   Resp:  Normal effort  MSK:   Moves extremities without difficulty  Other:  Slurred speech, broca's aphasia, disoriented  Medical Decision Making  Medically screening exam initiated at 1:49 PM.  Appropriate orders placed.  Delphina Cahill was informed that the remainder of the evaluation will be completed by another provider, this initial triage assessment does not replace that evaluation, and the importance of remaining in the ED until their evaluation is complete.  CODE Clayborne Dana, PA-C 03/26/22 1436

## 2022-03-26 NOTE — Hospital Course (Addendum)
Veronica Wood is a 71 yo female with PMH anxiety, HTN, HLD, depression, thyroid disease who was brought into the hospital due to confusion, feeling drowsy, and falling out of chair at home.  She also endorsed urinary retention and burning for several days prior to admission.  She had some report of dysarthria during work-up and code stroke was activated.  CT head was negative for acute changes.  CT head did show progression of underlying cerebral atrophy and chronic white matter changes since 2015. She was recently started on amantadine on 02/24/2022 for a 30-day supply for trial of suppressing tremors (no significant improvement per husband).  No other notable changes in medications in terms of new meds recently.  UDS was negative.  Urinalysis negative for signs of infection.  TSH normal, ammonia negative. She was admitted for further encephalopathy work-up.

## 2022-03-26 NOTE — Assessment & Plan Note (Signed)
-   Continue amlodipine, atenolol, losartan

## 2022-03-26 NOTE — ED Provider Notes (Signed)
[facility]-EMERGENCY DEPT Provider Note  CSN: [id number] Arrival date & time: 03/26/22 1301  Chief Complaint(s) Fall  HPI Veronica Wood is a 71 y.o. female with history of hypertension, hyperlipidemia, thyroid disease presenting to the emergency department with dysuria.  Patient reports feeling drowsy for 5 days, also reports some urinary retention..  She reports starting a new medicine starting with an "a" but cannot exactly say when this is.  She denies pain anywhere.  She also reports slurred speech which is new but does not know when this began.  She reports a chronic tremor which is unchanged.  History is limited due to confusion.   Past Medical History Past Medical History:  Diagnosis Date   Anxiety    Coronary atherosclerosis due to calcified coronary lesion    Depression    Hyperlipidemia    Hypertension    Thyroid disease    There are no problems to display for this patient.  Home Medication(s) Prior to Admission medications   Medication Sig Start Date End Date Taking? Authorizing Provider  amLODipine (NORVASC) 5 MG tablet Take 5 mg by mouth daily. 05/26/20   [provider]  aspirin EC 81 MG tablet Take 1 tablet (81 mg total) by mouth daily. Swallow whole. 07/27/20   Tolia, Sunit, DO  atenolol (TENORMIN) 25 MG tablet Take 25 mg by mouth 2 (two) times daily. 07/09/20   [provider]  atorvastatin (LIPITOR) 20 MG tablet TAKE 1 TABLET BY MOUTH EVERYDAY AT BEDTIME 05/22/21   Tolia, Sunit, DO  Calcium 250 MG CAPS Take 500 mg by mouth 2 (two) times daily.    [provider]  docusate sodium (COLACE) 100 MG capsule 1 capsule as needed    [provider]  DULoxetine (CYMBALTA) 60 MG capsule Take 60 mg by mouth daily. 01/21/16   [provider]  lamoTRIgine (LAMICTAL) 200 MG tablet Take 200 mg by mouth daily. 03/18/16   [provider]  levothyroxine (SYNTHROID, LEVOTHROID) 100 MCG tablet Take 50 mcg by  mouth every morning.  02/20/16   [provider]  LORazepam (ATIVAN) 0.5 MG tablet Take 0.5 mg by mouth 2 (two) times daily as needed. 05/25/20   [provider]  losartan (COZAAR) 25 MG tablet Take 25 mg by mouth daily. 08/20/20   [provider]  MAGNESIUM PO Take 1 tablet by mouth daily.    [provider]  QUEtiapine (SEROQUEL) 200 MG tablet Take 400-600 mg by mouth at bedtime. 02/20/16   [provider]                                                                                                                                    Past Surgical History Past Surgical History:  Procedure Laterality Date   COLONOSCOPY     DILATION AND CURETTAGE OF UTERUS     Family History Family History  Problem Relation Age  of Onset   Stroke Mother    Pulmonary embolism Father     Social History Social History   Tobacco Use   Smoking status: Former    Packs/day: 2.00    Years: 20.00    Total pack years: 40.00    Types: Cigarettes    Quit date: 07/27/1985    Years since quitting: 36.6   Smokeless tobacco: Never  Vaping Use   Vaping Use: Never used  Substance Use Topics   Alcohol use: Yes    Alcohol/week: 1.0 standard drink of alcohol    Types: 1 Shots of liquor per week    Comment: Occasionally in the evening   Drug use: Never   Allergies Celexa [citalopram hydrobromide]  Review of Systems Review of Systems  All other systems reviewed and are negative.   Physical Exam Vital Signs  I have reviewed the triage vital signs BP (!) 142/80   Pulse 97   Temp 98.4 F (36.9 C) (Oral)   Resp (!) 22   Ht 5\' 6"  (1.676 m)   Wt 72 kg   SpO2 100%   BMI 25.62 kg/m  Physical Exam Vitals and nursing note reviewed.  Constitutional:      General: She is not in acute distress.    Appearance: She is well-developed.  HENT:     Head: Normocephalic and atraumatic.     Mouth/Throat:     Mouth: Mucous membranes are moist.  Eyes:     Pupils:  Pupils are equal, round, and reactive to light.  Cardiovascular:     Rate and Rhythm: Normal rate and regular rhythm.     Heart sounds: No murmur heard. Pulmonary:     Effort: Pulmonary effort is normal. No respiratory distress.     Breath sounds: Normal breath sounds.  Abdominal:     General: Abdomen is flat.     Palpations: Abdomen is soft.     Tenderness: There is no abdominal tenderness.  Musculoskeletal:        General: No tenderness.     Right lower leg: No edema.     Left lower leg: No edema.  Skin:    General: Skin is warm and dry.  Neurological:     Mental Status: She is alert. Mental status is at baseline.     Comments: Cranial nerves II through XII intact, strength 5 out of 5 in the bilateral upper and lower extremities.  Tremors present, slurred speech but no facial droop.  Patient appears confused, gives bizarre answers to some questions  Psychiatric:        Mood and Affect: Mood normal.        Behavior: Behavior normal.     ED Results and Treatments Labs (all labs ordered are listed, but only abnormal results are displayed) Labs Reviewed  CBC - Abnormal; Notable for the following components:      Result Value   WBC 11.3 (*)    All other components within normal limits  DIFFERENTIAL - Abnormal; Notable for the following components:   Neutro Abs 8.4 (*)    All other components within normal limits  COMPREHENSIVE METABOLIC PANEL - Abnormal; Notable for the following components:   Potassium 3.3 (*)    Glucose, Bld 132 (*)    Creatinine, Ser 1.20 (*)    AST 46 (*)    GFR, Estimated 49 (*)    All other components within normal limits  URINALYSIS, ROUTINE W REFLEX MICROSCOPIC - Abnormal; Notable for the following  components:   Hgb urine dipstick SMALL (*)    All other components within normal limits  I-STAT CHEM 8, ED - Abnormal; Notable for the following components:   Sodium 132 (*)    Potassium 7.6 (*)    BUN 25 (*)    Creatinine, Ser 1.20 (*)    Glucose,  Bld 127 (*)    Calcium, Ion 1.00 (*)    All other components within normal limits  RESP PANEL BY RT-PCR (FLU A&B, COVID) ARPGX2  ETHANOL  PROTIME-INR  APTT  RAPID URINE DRUG SCREEN, HOSP PERFORMED  TSH  AMMONIA  CBG MONITORING, ED                                                                                                                          Radiology CT HEAD CODE STROKE WO CONTRAST  Result Date: 03/26/2022 CLINICAL DATA:  Code stroke. Acute neuro deficit. Aphasia rule out stroke EXAM: CT HEAD WITHOUT CONTRAST TECHNIQUE: Contiguous axial images were obtained from the base of the skull through the vertex without intravenous contrast. RADIATION DOSE REDUCTION: This exam was performed according to the departmental dose-optimization program which includes automated exposure control, adjustment of the mA and/or kV according to patient size and/or use of iterative reconstruction technique. COMPARISON:  CT head 07/06/2013 FINDINGS: Brain: Generalized atrophy has progressed. Mild ventricular enlargement consistent with the degree of atrophy. Periventricular white matter hypodensity bilaterally has progressed in the interval. Negative for acute infarct, hemorrhage, mass Vascular: Negative for hyperdense vessel Skull: Negative Sinuses/Orbits: Paranasal sinuses clear. Bilateral cataract extraction Other: None ASPECTS (Alberta Stroke Program Early CT Score) - Ganglionic level infarction (caudate, lentiform nuclei, internal capsule, insula, M1-M3 cortex): 7 - Supraganglionic infarction (M4-M6 cortex): 3 Total score (0-10 with 10 being normal): 10 IMPRESSION: 1. No acute abnormality 2. ASPECTS is 10 3. Progression of atrophy and chronic white matter changes since 2015 4. These results were called by telephone at the time of interpretation on 03/26/2022 at 2:19 pm to provider GrotonScheving, who verbally acknowledged these results. Electronically Signed   By: Marlan Palauharles  Clark M.D.   On: 03/26/2022 14:19     Pertinent labs & imaging results that were available during my care of the patient were reviewed by me and considered in my medical decision making (see MDM for details).  Medications Ordered in ED Medications - No data to display  Procedures Procedures  (including critical care time)  Medical Decision Making / ED Course   MDM:  71 year old female presenting to the emergency department with confusion.  It seems times have likely been present for 5 days although history is difficult.  CT head without acute intracranial process.  Given slurred speech, in triage patient was activated as a code stroke, stroke neurology evaluated the patient, felt the patient's symptoms are more likely due to encephalopathy and she did not have a focal deficit.  Timeline is also unclear.  Patient may have fallen and hit her head, CT scan without signs of bleeding.  Metabolic work-up overall unremarkable, slight AKI.  Urinalysis without evidence of urinary tract infection on in and out cath, but per nursing patient was retaining around 1 L of urine.  Not having back pain to suggest spinal cord process and does not have any focal neurologic deficits in the lower extremity.  Suspect toxic or metabolic encephalopathy.  Send ammonia and TSH.  Discussed with the hospitalist will admit the patient for further work-up.  Clinical Course as of 03/26/22 1547  Wed Mar 26, 2022  1355 Evaluated patient in triage at 13:55. Patient initially lucid but started to slur speech and later had difficulty producing words. Also at one point unsure where she was.   [JR]  1439 Potassium(!!): 7.6 Not real [WS]    Clinical Course User Index [JR] Gareth Eagle, PA-C [WS] Suezanne Jacquet Jerilee Field, MD     Additional history obtained: -Additional history obtained from family -External records from outside  source obtained and reviewed including: Chart review including previous notes, labs, imaging, consultation notes   Lab Tests: -I ordered, reviewed, and interpreted labs.   The pertinent results include:   Labs Reviewed  CBC - Abnormal; Notable for the following components:      Result Value   WBC 11.3 (*)    All other components within normal limits  DIFFERENTIAL - Abnormal; Notable for the following components:   Neutro Abs 8.4 (*)    All other components within normal limits  COMPREHENSIVE METABOLIC PANEL - Abnormal; Notable for the following components:   Potassium 3.3 (*)    Glucose, Bld 132 (*)    Creatinine, Ser 1.20 (*)    AST 46 (*)    GFR, Estimated 49 (*)    All other components within normal limits  URINALYSIS, ROUTINE W REFLEX MICROSCOPIC - Abnormal; Notable for the following components:   Hgb urine dipstick SMALL (*)    All other components within normal limits  I-STAT CHEM 8, ED - Abnormal; Notable for the following components:   Sodium 132 (*)    Potassium 7.6 (*)    BUN 25 (*)    Creatinine, Ser 1.20 (*)    Glucose, Bld 127 (*)    Calcium, Ion 1.00 (*)    All other components within normal limits  RESP PANEL BY RT-PCR (FLU A&B, COVID) ARPGX2  ETHANOL  PROTIME-INR  APTT  RAPID URINE DRUG SCREEN, HOSP PERFORMED  TSH  AMMONIA  CBG MONITORING, ED      EKG   EKG Interpretation  Date/Time:    Ventricular Rate:    PR Interval:    QRS Duration:   QT Interval:    QTC Calculation:   R Axis:     Text Interpretation:           Imaging Studies ordered: I ordered imaging studies including CT head On my interpretation imaging demonstrates no acute intracranial process  I independently visualized and interpreted imaging. I agree with the radiologist interpretation   Medicines ordered and prescription drug management: No orders of the defined types were placed in this encounter.   -I have reviewed the patients home medicines and have made  adjustments as needed   Consultations Obtained: I requested consultation with the neurologist,  and discussed lab and imaging findings as well as pertinent plan - they recommend: workup for metabolic encephalopathy   Cardiac Monitoring: The patient was maintained on a cardiac monitor.  I personally viewed and interpreted the cardiac monitored which showed an underlying rhythm of: NSR  Social Determinants of Health:  Factors impacting patients care include: former smoker   Reevaluation: After the interventions noted above, I reevaluated the patient and found that they have improved  Co morbidities that complicate the patient evaluation  Past Medical History:  Diagnosis Date   Anxiety    Coronary atherosclerosis due to calcified coronary lesion    Depression    Hyperlipidemia    Hypertension    Thyroid disease       Dispostion: Admit    Final Clinical Impression(s) / ED Diagnoses Final diagnoses:  Encephalopathy     This chart was dictated using voice recognition software.  Despite best efforts to proofread,  errors can occur which can change the documentation meaning.    Cristie Hem, MD 03/26/22 [phone]

## 2022-03-26 NOTE — ED Notes (Signed)
I gave MD critical I Stat Chem 8 results

## 2022-03-26 NOTE — Assessment & Plan Note (Signed)
-   TSH normal, 0.665 - Continue Synthroid

## 2022-03-26 NOTE — Assessment & Plan Note (Addendum)
-   apparent acute presentation; per husband her baseline is typically alert and oriented. He also endorses she has episodes like this occasionally - normal TSH and NH3 - negative UDS and UA - differential includes medication side effect (possible from amantadine? vs recent trial of inc dose of seroquel) vs conversion vs underlying significant cerebral atrophy (dementia) vs vitamin/nutrient deficiency - MRI brain 9/28 negative for stroke and shows advanced atrophy which I've explained can contribute to demential-like behavior - also low B12 and folate levels: started on supplementation - continue thiamine; B1 level normal -Repeat NH3 due to ongoing confusion.  Still normal

## 2022-03-26 NOTE — Progress Notes (Signed)
Dr Quinn Axe on camera at this time, pt rolling back from [place]

## 2022-03-27 ENCOUNTER — Inpatient Hospital Stay (HOSPITAL_COMMUNITY): Payer: Medicare Other

## 2022-03-27 DIAGNOSIS — R338 Other retention of urine: Secondary | ICD-10-CM | POA: Diagnosis not present

## 2022-03-27 DIAGNOSIS — N179 Acute kidney failure, unspecified: Secondary | ICD-10-CM | POA: Diagnosis not present

## 2022-03-27 DIAGNOSIS — G9341 Metabolic encephalopathy: Secondary | ICD-10-CM | POA: Diagnosis not present

## 2022-03-27 LAB — CBC WITH DIFFERENTIAL/PLATELET
Abs Immature Granulocytes: 0.03 10*3/uL (ref 0.00–0.07)
Basophils Absolute: 0 10*3/uL (ref 0.0–0.1)
Basophils Relative: 1 %
Eosinophils Absolute: 0.1 10*3/uL (ref 0.0–0.5)
Eosinophils Relative: 2 %
HCT: 36.4 % (ref 36.0–46.0)
Hemoglobin: 12.2 g/dL (ref 12.0–15.0)
Immature Granulocytes: 1 %
Lymphocytes Relative: 19 %
Lymphs Abs: 1.2 10*3/uL (ref 0.7–4.0)
MCH: 33 pg (ref 26.0–34.0)
MCHC: 33.5 g/dL (ref 30.0–36.0)
MCV: 98.4 fL (ref 80.0–100.0)
Monocytes Absolute: 0.6 10*3/uL (ref 0.1–1.0)
Monocytes Relative: 9 %
Neutro Abs: 4.3 10*3/uL (ref 1.7–7.7)
Neutrophils Relative %: 68 %
Platelets: 246 10*3/uL (ref 150–400)
RBC: 3.7 MIL/uL — ABNORMAL LOW (ref 3.87–5.11)
RDW: 13.3 % (ref 11.5–15.5)
WBC: 6.3 10*3/uL (ref 4.0–10.5)
nRBC: 0 % (ref 0.0–0.2)

## 2022-03-27 LAB — COMPREHENSIVE METABOLIC PANEL
ALT: 31 U/L (ref 0–44)
AST: 68 U/L — ABNORMAL HIGH (ref 15–41)
Albumin: 3.7 g/dL (ref 3.5–5.0)
Alkaline Phosphatase: 59 U/L (ref 38–126)
Anion gap: 7 (ref 5–15)
BUN: 15 mg/dL (ref 8–23)
CO2: 27 mmol/L (ref 22–32)
Calcium: 8.6 mg/dL — ABNORMAL LOW (ref 8.9–10.3)
Chloride: 103 mmol/L (ref 98–111)
Creatinine, Ser: 1.05 mg/dL — ABNORMAL HIGH (ref 0.44–1.00)
GFR, Estimated: 57 mL/min — ABNORMAL LOW (ref >60)
Glucose, Bld: 104 mg/dL — ABNORMAL HIGH (ref 70–99)
Potassium: 3.1 mmol/L — ABNORMAL LOW (ref 3.5–5.1)
Sodium: 137 mmol/L (ref 135–145)
Total Bilirubin: 0.8 mg/dL (ref 0.3–1.2)
Total Protein: 6.1 g/dL — ABNORMAL LOW (ref 6.5–8.1)

## 2022-03-27 LAB — MAGNESIUM: Magnesium: 1.8 mg/dL (ref 1.7–2.4)

## 2022-03-27 LAB — HIV ANTIBODY (ROUTINE TESTING W REFLEX): HIV Screen 4th Generation wRfx: NONREACTIVE

## 2022-03-27 MED ORDER — CYANOCOBALAMIN 1000 MCG/ML IJ SOLN
1000.0000 ug | Freq: Every day | INTRAMUSCULAR | Status: DC
  Administered 2022-03-27 – 2022-04-03 (×8): 1000 ug via SUBCUTANEOUS
  Filled 2022-03-27 (×10): qty 1

## 2022-03-27 MED ORDER — POTASSIUM CHLORIDE CRYS ER 20 MEQ PO TBCR
40.0000 meq | EXTENDED_RELEASE_TABLET | Freq: Once | ORAL | Status: AC
  Administered 2022-03-27: 40 meq via ORAL
  Filled 2022-03-27: qty 2

## 2022-03-27 MED ORDER — ATENOLOL 50 MG PO TABS: 50.0000 mg | ORAL_TABLET | Freq: Every day | ORAL | 3 refills | Status: AC

## 2022-03-27 MED ORDER — LEVOTHYROXINE SODIUM 50 MCG PO TABS
50.0000 ug | ORAL_TABLET | Freq: Every day | ORAL | Status: DC
  Administered 2022-03-27 – 2022-04-03 (×8): 50 ug via ORAL
  Filled 2022-03-27 (×8): qty 1

## 2022-03-27 MED ORDER — LORATADINE 10 MG PO TABS
10.0000 mg | ORAL_TABLET | Freq: Every day | ORAL | Status: DC
  Administered 2022-03-28 – 2022-04-03 (×6): 10 mg via ORAL
  Filled 2022-03-27 (×10): qty 1

## 2022-03-27 MED ORDER — MELATONIN 5 MG PO TABS
5.0000 mg | ORAL_TABLET | Freq: Once | ORAL | Status: AC
  Administered 2022-03-27: 5 mg via ORAL
  Filled 2022-03-27: qty 1

## 2022-03-27 MED ORDER — ADULT MULTIVITAMIN W/MINERALS CH: 1.0000 | ORAL_TABLET | Freq: Every day | ORAL | Status: DC

## 2022-03-27 MED ORDER — VITAMIN B-12 1000 MCG PO TABS: 1000.0000 ug | ORAL_TABLET | Freq: Every day | ORAL | Status: DC

## 2022-03-27 MED ORDER — FOLIC ACID 1 MG PO TABS: 1.0000 mg | ORAL_TABLET | Freq: Every day | ORAL | Status: DC

## 2022-03-27 MED ORDER — ADULT MULTIVITAMIN W/MINERALS CH
1.0000 | ORAL_TABLET | Freq: Every day | ORAL | Status: DC
  Administered 2022-03-27 – 2022-04-03 (×8): 1 via ORAL
  Filled 2022-03-27 (×8): qty 1

## 2022-03-27 MED ORDER — FOLIC ACID 1 MG PO TABS
1.0000 mg | ORAL_TABLET | Freq: Every day | ORAL | Status: DC
  Administered 2022-03-27 – 2022-04-03 (×8): 1 mg via ORAL
  Filled 2022-03-27 (×8): qty 1

## 2022-03-27 MED ORDER — VITAMIN B-12 1000 MCG PO TABS: 1000.0000 ug | ORAL_TABLET | Freq: Every day | ORAL | Status: AC

## 2022-03-27 MED ORDER — POLYVINYL ALCOHOL 1.4 % OP SOLN: 2.0000 [drp] | OPHTHALMIC | Status: DC | PRN

## 2022-03-27 MED ORDER — VITAMIN B-1 100 MG PO TABS: 100.0000 mg | ORAL_TABLET | Freq: Every day | ORAL | Status: DC

## 2022-03-27 NOTE — Progress Notes (Signed)
Progress Note    Veronica Wood   DJM:[id number]  DOB: 05/17/1951  DOA: 03/26/2022     1 PCP: Merri Brunette, MD  Initial CC:   Hospital Course: Ms. Laflam is a 71 yo female with PMH anxiety, HTN, HLD, depression, thyroid disease who was brought into the hospital due to confusion, feeling drowsy, and falling out of chair at home.  She also endorsed urinary retention and burning for several days prior to admission.  She had some report of dysarthria during work-up and code stroke was activated.  CT head was negative for acute changes.  CT head did show progression of underlying cerebral atrophy and chronic white matter changes since 2015. She was recently started on amantadine on 02/24/2022 for a 30-day supply for trial of suppressing tremors (no significant improvement per husband).  No other notable changes in medications in terms of new meds recently.  UDS was negative.  Urinalysis negative for signs of infection.  TSH normal, ammonia negative. She was admitted for further encephalopathy work-up.  Interval History:  No events overnight.  Patient improved this morning in terms of mentation.  She is alert and oriented to name, place, president, year.  Husband present bedside as well this morning confirming improvement in her mentation. She did have ongoing urinary retention this afternoon requiring insertion of Foley catheter.  Assessment and Plan: * Acute metabolic encephalopathy - apparent acute presentation; per husband her baseline is typically alert and oriented. He also endorses she has episodes like this occasionally - normal TSH and NH3 - negative UDS and UA - differential includes medication side effect (possible from amantadine? vs recent trial of inc dose of seroquel) vs conversion vs underlying significant cerebral atrophy (dementia) vs vitamin/nutrient deficiency - MRI brain 9/28 negative for stroke and shows advanced atrophy which I've explained can contribute to  demential-like behavior - also low B12 and folate levels: started on supplementation - continue thiamine; follow up B1 level which will typically take several days to return  Acute urinary retention - Continues to have high PVRs - Etiology possibly in setting of her in acute encephalopathy - Insert Foley for now and will perform trial of void when mentation further improves; if fails again, will need reinsertion and outpatient follow-up with urology  AKI (acute kidney injury) (HCC) - baseline creatinine ~ 0.8 - patient presents with increase in creat >0.3 mg/dL above baseline, creat increase >1.5x baseline presumed to have occurred within past 7 days PTA -Suspected prerenal for now vs due to urinary retention  - some improvement with fluids but still retaining urine; see urinary retention -Continue diet.  Okay to stop fluids.  Continue Foley  Hypothyroidism - TSH normal, 0.665 - Continue Synthroid  Hypertension - Continue amlodipine, atenolol, losartan  Hyperlipidemia - Hold statin for now  Coronary atherosclerosis due to calcified coronary lesion - evaluated outpatient by cardiology, last seen by Dr. Odis Hollingshead on 02/21/2021 -Patient recommended to continue on aspirin and statin   Old records reviewed in assessment of this patient  Antimicrobials:   DVT prophylaxis:  enoxaparin (LOVENOX) injection 40 mg Start: 03/27/22 1000   Code Status:   Code Status: Full Code  Mobility Assessment (last 72 hours)     Mobility Assessment     Row Name 03/27/22 1600           Does patient have an order for bedrest or is patient medically unstable No - Continue assessment       What is the highest level of  mobility based on the progressive mobility assessment? Level 2 (Chairfast) - Balance while sitting on edge of bed and cannot stand       Is the above level different from baseline mobility prior to current illness? Yes - Recommend PT order                Barriers to discharge:  none Disposition Plan:  Pending PT eval Status is: Inpt  Objective: Blood pressure (!) 168/80, pulse 86, temperature 99 F (37.2 C), temperature source Oral, resp. rate 17, height 5\' 6"  (1.676 m), weight 72 kg, SpO2 94 %.  Examination:  Physical Exam Constitutional:      General: She is not in acute distress.    Appearance: She is not ill-appearing.     Comments: Greatly improved mentation.  HENT:     Head: Normocephalic and atraumatic.     Mouth/Throat:     Mouth: Mucous membranes are moist.  Eyes:     Extraocular Movements: Extraocular movements intact.  Cardiovascular:     Rate and Rhythm: Normal rate and regular rhythm.  Pulmonary:     Effort: Pulmonary effort is normal.     Breath sounds: Normal breath sounds.  Abdominal:     General: Bowel sounds are normal. There is no distension.     Palpations: Abdomen is soft.     Tenderness: There is no abdominal tenderness.  Musculoskeletal:        General: Normal range of motion.     Cervical back: Normal range of motion and neck supple.  Skin:    General: Skin is warm and dry.  Neurological:     Mental Status: She is alert.     Comments: AO x 3. Easily following commands      Consultants:    Procedures:    Data Reviewed: Results for orders placed or performed during the hospital encounter of 03/26/22 (from the past 24 hour(s))  CBC with Differential/Platelet     Status: Abnormal   Collection Time: 03/27/22  4:15 AM  Result Value Ref Range   WBC 6.3 4.0 - 10.5 K/uL   RBC 3.70 (L) 3.87 - 5.11 MIL/uL   Hemoglobin 12.2 12.0 - 15.0 g/dL   HCT 03/29/22 83.4 - 19.6 %   MCV 98.4 80.0 - 100.0 fL   MCH 33.0 26.0 - 34.0 pg   MCHC 33.5 30.0 - 36.0 g/dL   RDW 22.2 97.9 - 89.2 %   Platelets 246 150 - 400 K/uL   nRBC 0.0 0.0 - 0.2 %   Neutrophils Relative % 68 %   Neutro Abs 4.3 1.7 - 7.7 K/uL   Lymphocytes Relative 19 %   Lymphs Abs 1.2 0.7 - 4.0 K/uL   Monocytes Relative 9 %   Monocytes Absolute 0.6 0.1 - 1.0 K/uL    Eosinophils Relative 2 %   Eosinophils Absolute 0.1 0.0 - 0.5 K/uL   Basophils Relative 1 %   Basophils Absolute 0.0 0.0 - 0.1 K/uL   Immature Granulocytes 1 %   Abs Immature Granulocytes 0.03 0.00 - 0.07 K/uL  Comprehensive metabolic panel     Status: Abnormal   Collection Time: 03/27/22  4:15 AM  Result Value Ref Range   Sodium 137 135 - 145 mmol/L   Potassium 3.1 (L) 3.5 - 5.1 mmol/L   Chloride 103 98 - 111 mmol/L   CO2 27 22 - 32 mmol/L   Glucose, Bld 104 (H) 70 - 99 mg/dL   BUN 15 8 -  23 mg/dL   Creatinine, Ser 1.05 (H) 0.44 - 1.00 mg/dL   Calcium 8.6 (L) 8.9 - 10.3 mg/dL   Total Protein 6.1 (L) 6.5 - 8.1 g/dL   Albumin 3.7 3.5 - 5.0 g/dL   AST 68 (H) 15 - 41 U/L   ALT 31 0 - 44 U/L   Alkaline Phosphatase 59 38 - 126 U/L   Total Bilirubin 0.8 0.3 - 1.2 mg/dL   GFR, Estimated 57 (L) >60 mL/min   Anion gap 7 5 - 15  Magnesium     Status: None   Collection Time: 03/27/22  4:15 AM  Result Value Ref Range   Magnesium 1.8 1.7 - 2.4 mg/dL  HIV Antibody (routine testing w rflx)     Status: None   Collection Time: 03/27/22  4:15 AM  Result Value Ref Range   HIV Screen 4th Generation wRfx Non Reactive Non Reactive    I have Reviewed nursing notes, Vitals, and Lab results since pt's last encounter. Pertinent lab results : see above I have ordered test including BMP, CBC, Mg I have reviewed the last note from staff over past 24 hours I have discussed pt's care plan and test results with nursing staff, case manager   LOS: 1 day   Dwyane Dee, MD [facility] 03/27/2022, 6:29 PM

## 2022-03-27 NOTE — Progress Notes (Signed)
PT Cancellation Note  Patient Details Name: Veronica Wood MRN: [id number] DOB: 04-12-1951   Cancelled Treatment:    Reason Eval/Treat Not Completed: Other (comment) (transferring to the floor). Reported to pt's room in ED for PT eval, but informed pt being transferred to room on 5W, waiting on bed. Will check back for eval as schedule permits.    Talbot Grumbling PT, DPT 03/27/22, 3:35 PM

## 2022-03-27 NOTE — Assessment & Plan Note (Addendum)
-   Continued to have high PVRs - Etiology possibly in setting of her in acute encephalopathy - foley placed on 9/28; will need TOV again prior to discharge if regains more mentation and strength improvement vs leaving foley in longer and attempt after more re-conditioning

## 2022-03-27 NOTE — ED Notes (Signed)
Pt had yellow eye drainage and stated "it's itching". MD made aware.

## 2022-03-27 NOTE — ED Notes (Signed)
Pt resting and watching TV, NAD noted, observed even RR and unlabored, side rails up x2 for safety, plan of care ongoing, pt expresses no needs or concerns at this time, call light within reach, no further concerns as of present.  

## 2022-03-27 NOTE — ED Notes (Signed)
Pt given diet coke to drink, no difficulty swallowing and no coughing noted

## 2022-03-28 DIAGNOSIS — G9341 Metabolic encephalopathy: Secondary | ICD-10-CM | POA: Diagnosis not present

## 2022-03-28 DIAGNOSIS — R338 Other retention of urine: Secondary | ICD-10-CM | POA: Diagnosis not present

## 2022-03-28 DIAGNOSIS — N179 Acute kidney failure, unspecified: Secondary | ICD-10-CM | POA: Diagnosis not present

## 2022-03-28 LAB — VITAMIN B1: Vitamin B1 (Thiamine): 99.8 nmol/L (ref 66.5–200.0)

## 2022-03-28 MED ORDER — MELATONIN 5 MG PO TABS
5.0000 mg | ORAL_TABLET | Freq: Once | ORAL | Status: AC
  Administered 2022-03-28: 5 mg via ORAL
  Filled 2022-03-28: qty 1

## 2022-03-28 MED ORDER — CHLORHEXIDINE GLUCONATE CLOTH 2 % EX PADS
6.0000 | MEDICATED_PAD | Freq: Every day | CUTANEOUS | Status: DC
  Administered 2022-03-28 – 2022-04-03 (×7): 6 via TOPICAL

## 2022-03-28 NOTE — Progress Notes (Signed)
Progress Note    Veronica Wood   ASN:[id number]  DOB: 11/08/1950  DOA: 03/26/2022     2 PCP: Deland Pretty, MD  Initial CC:   Hospital Course: Veronica Wood is a 71 yo female with PMH anxiety, HTN, HLD, depression, thyroid disease who was brought into the hospital due to confusion, feeling drowsy, and falling out of chair at home.  She also endorsed urinary retention and burning for several days prior to admission.  She had some report of dysarthria during work-up and code stroke was activated.  CT head was negative for acute changes.  CT head did show progression of underlying cerebral atrophy and chronic white matter changes since 2015. She was recently started on amantadine on 02/24/2022 for a 30-day supply for trial of suppressing tremors (no significant improvement per husband).  No other notable changes in medications in terms of new meds recently.  UDS was negative.  Urinalysis negative for signs of infection.  TSH normal, ammonia negative. She was admitted for further encephalopathy work-up.  Interval History:  No events overnight.  Husband present bedside.  We discussed probable need for rehab which they were amenable with.  Her mentation is still slightly less than normal per husband and strength is also weak and he does not think she would be able to get up the stairs into the home.  Assessment and Plan: * Acute metabolic encephalopathy - apparent acute presentation; per husband her baseline is typically alert and oriented. He also endorses she has episodes like this occasionally - normal TSH and NH3 - negative UDS and UA - differential includes medication side effect (possible from amantadine? vs recent trial of inc dose of seroquel) vs conversion vs underlying significant cerebral atrophy (dementia) vs vitamin/nutrient deficiency - MRI brain 9/28 negative for stroke and shows advanced atrophy which I've explained can contribute to demential-like behavior - also low B12 and  folate levels: started on supplementation - continue thiamine; follow up B1 level which will typically take several days to return  Acute urinary retention - Continued to have high PVRs - Etiology possibly in setting of her in acute encephalopathy - foley placed on 9/28; will need TOV again prior to discharge if regains more mentation and strength improvement vs leaving foley in longer and attempt after more re-conditioning  AKI (acute kidney injury) ([place]) - baseline creatinine ~ 0.8 - patient presents with increase in creat >0.3 mg/dL above baseline, creat increase >1.5x baseline presumed to have occurred within past 7 days PTA -Suspected prerenal for now vs due to urinary retention  - some improvement with fluids but still retaining urine; see urinary retention -Continue diet.  Okay to stop fluids.  Continue Foley  Hypothyroidism - TSH normal, 0.665 - Continue Synthroid  Hypertension - Continue amlodipine, atenolol, losartan  Hyperlipidemia - Hold statin for now  Coronary atherosclerosis due to calcified coronary lesion - evaluated outpatient by cardiology, last seen by Dr. Terri Skains on 02/21/2021 -Patient recommended to continue on aspirin and statin   Old records reviewed in assessment of this patient  Antimicrobials:   DVT prophylaxis:  enoxaparin (LOVENOX) injection 40 mg Start: 03/27/22 1000   Code Status:   Code Status: Full Code  Mobility Assessment (last 72 hours)     Mobility Assessment     Row Name 03/28/22 1217 03/27/22 2138 03/27/22 1600       Does patient have an order for bedrest or is patient medically unstable -- No - Continue assessment No - Continue assessment  What is the highest level of mobility based on the progressive mobility assessment? Level 5 (Walks with assist in room/hall) - Balance while stepping forward/back and can walk in room with assist - Complete Level 2 (Chairfast) - Balance while sitting on edge of bed and cannot stand Level 2  (Chairfast) - Balance while sitting on edge of bed and cannot stand     Is the above level different from baseline mobility prior to current illness? -- Yes - Recommend PT order Yes - Recommend PT order              Barriers to discharge: none Disposition Plan: SNF Status is: Inpt  Objective: Blood pressure 135/81, pulse 69, temperature 98.8 F (37.1 C), temperature source Oral, resp. rate 16, height 5\' 6"  (1.676 m), weight 72 kg, SpO2 96 %.  Examination:  Physical Exam Constitutional:      General: She is not in acute distress.    Appearance: She is not ill-appearing.     Comments: Greatly improved mentation.  HENT:     Head: Normocephalic and atraumatic.     Mouth/Throat:     Mouth: Mucous membranes are moist.  Eyes:     Extraocular Movements: Extraocular movements intact.  Cardiovascular:     Rate and Rhythm: Normal rate and regular rhythm.  Pulmonary:     Effort: Pulmonary effort is normal.     Breath sounds: Normal breath sounds.  Abdominal:     General: Bowel sounds are normal. There is no distension.     Palpations: Abdomen is soft.     Tenderness: There is no abdominal tenderness.  Musculoskeletal:        General: Normal range of motion.     Cervical back: Normal range of motion and neck supple.  Skin:    General: Skin is warm and dry.  Neurological:     Mental Status: She is alert.     Comments: AO x 3. Easily following commands      Consultants:    Procedures:    Data Reviewed: No results found for this or any previous visit (from the past 24 hour(s)).   I have Reviewed nursing notes, Vitals, and Lab results since pt's last encounter. Pertinent lab results : see above I have ordered test including BMP, CBC, Mg I have reviewed the last note from staff over past 24 hours I have discussed pt's care plan and test results with nursing staff, case manager   LOS: 2 days   , MD [facility] 03/28/2022, 4:17 PM

## 2022-03-28 NOTE — Evaluation (Signed)
Physical Therapy Evaluation Patient Details Name: Veronica Wood MRN: [id number] DOB: 29-Jan-1951 Today's Date: 03/28/2022  History of Present Illness  71 yo female with PMH anxiety, HTN, HLD, depression, thyroid disease who was brought into the hospital due to confusion, feeling drowsy, and falling out of chair at home and admitted 03/26/22 for Acute metabolic encephalopathy.  "MRI brain 9/28 negative for stroke and shows advanced atrophy which I've explained can contribute to demential-like behavior" per MD note  Clinical Impression  Pt admitted with above diagnosis.  Pt currently with functional limitations due to the deficits listed below (see PT Problem List). Pt will benefit from skilled PT to increase their independence and safety with mobility to allow discharge to the venue listed below.   Pt requiring at least mod assist for mobility today.  Spouse present and reports he cannot physically assist and agreeable to SNF upon d/c.          Recommendations for follow up therapy are one component of a multi-disciplinary discharge planning process, led by the attending physician.  Recommendations may be updated based on patient status, additional functional criteria and insurance authorization.  Follow Up Recommendations Skilled nursing-short term rehab (<3 hours/day) Can patient physically be transported by private vehicle: No    Assistance Recommended at Discharge Frequent or constant Supervision/Assistance  Patient can return home with the following  A little help with walking and/or transfers;A little help with bathing/dressing/bathroom;Help with stairs or ramp for entrance;Assistance with cooking/housework;Assist for transportation    Equipment Recommendations None recommended by PT  Recommendations for Other Services       Functional Status Assessment Patient has had a recent decline in their functional status and demonstrates the ability to make significant improvements in function  in a reasonable and predictable amount of time.     Precautions / Restrictions Precautions Precautions: Fall      Mobility  Bed Mobility Overal bed mobility: Needs Assistance Bed Mobility: Supine to Sit     Supine to sit: Min assist     General bed mobility comments: assist for trunk    Transfers Overall transfer level: Needs assistance Equipment used: Rolling walker (2 wheels) Transfers: Sit to/from Stand Sit to Stand: Mod assist           General transfer comment: posterior bias upon rise, assist for rise and steady    Ambulation/Gait Ambulation/Gait assistance: Min assist Gait Distance (Feet): 60 Feet Assistive device: Rolling walker (2 wheels) Gait Pattern/deviations: Ataxic, Decreased stride length, Step-through pattern       General Gait Details: verbal cues for use of UEs through RW for steadying, assist for stabilizing, pt fatigued quickly  Stairs            Wheelchair Mobility    Modified Rankin (Stroke Patients Only)       Balance Overall balance assessment: History of Falls, Needs assistance         Standing balance support: Bilateral upper extremity supported, Reliant on assistive device for balance Standing balance-Leahy Scale: Poor                               Pertinent Vitals/Pain Pain Assessment Pain Assessment: No/denies pain    Home Living Family/patient expects to be discharged to:: Private residence Living Arrangements: Spouse/significant other   Type of Home: House       Alternate Level Stairs-Number of Steps: 16 Home Layout: Two level;Bed/bath upstairs;1/2 bath on main level  Home Equipment: Agricultural consultant (2 wheels)      Prior Function Prior Level of Function : Independent/Modified Independent             Mobility Comments: was ambulatory without assistive device, reports lately crawling to get around if spouse is unable to assist ADLs Comments: spouse reports they just returned from the  beach house, spouse was able to perform stairs and ambulate until a few day prior to admission     Hand Dominance        Extremity/Trunk Assessment        Lower Extremity Assessment Lower Extremity Assessment: Generalized weakness    Cervical / Trunk Assessment Cervical / Trunk Assessment: Normal  Communication   Communication: No difficulties  Cognition Arousal/Alertness: Awake/alert Behavior During Therapy: WFL for tasks assessed/performed Overall Cognitive Status: History of cognitive impairments - at baseline                                 General Comments: possible dementia, MRI: advanced atrophy        General Comments      Exercises     Assessment/Plan    PT Assessment Patient needs continued PT services  PT Problem List Decreased strength;Decreased activity tolerance;Decreased balance;Decreased mobility;Decreased knowledge of use of DME;Decreased coordination       PT Treatment Interventions DME instruction;Gait training;Balance training;Therapeutic exercise;Functional mobility training;Therapeutic activities;Patient/family education;Neuromuscular re-education    PT Goals (Current goals can be found in the Care Plan section)  Acute Rehab PT Goals PT Goal Formulation: With patient Time For Goal Achievement: 04/11/22 Potential to Achieve Goals: Good    Frequency Min 2X/week     Co-evaluation               AM-PAC PT "6 Clicks" Mobility  Outcome Measure Help needed turning from your back to your side while in a flat bed without using bedrails?: A Little Help needed moving from lying on your back to sitting on the side of a flat bed without using bedrails?: A Little Help needed moving to and from a bed to a chair (including a wheelchair)?: A Lot Help needed standing up from a chair using your arms (e.g., wheelchair or bedside chair)?: A Lot Help needed to walk in hospital room?: A Lot Help needed climbing 3-5 steps with a railing?  : A Lot 6 Click Score: 14    End of Session Equipment Utilized During Treatment: Gait belt Activity Tolerance: Patient tolerated treatment well Patient left: in chair;with call bell/phone within reach;with chair alarm set;with family/visitor present Nurse Communication: Mobility status PT Visit Diagnosis: Difficulty in walking, not elsewhere classified (R26.2);Unsteadiness on feet (R26.81)    Time: 2725-3664 PT Time Calculation (min) (ACUTE ONLY): 12 min   Charges:   PT Evaluation $PT Eval Low Complexity: 1 Low         Kati PT, DPT Physical Therapist Acute Rehabilitation Services Preferred contact method: Secure Chat Weekend Pager Only: [phone] Office: [phone]   Janan Halter [place] 03/28/2022, 1:54 PM

## 2022-03-29 DIAGNOSIS — N179 Acute kidney failure, unspecified: Secondary | ICD-10-CM | POA: Diagnosis not present

## 2022-03-29 DIAGNOSIS — R338 Other retention of urine: Secondary | ICD-10-CM | POA: Diagnosis not present

## 2022-03-29 DIAGNOSIS — G9341 Metabolic encephalopathy: Secondary | ICD-10-CM | POA: Diagnosis not present

## 2022-03-29 LAB — BASIC METABOLIC PANEL
Anion gap: 7 (ref 5–15)
BUN: 12 mg/dL (ref 8–23)
CO2: 24 mmol/L (ref 22–32)
Calcium: 8.7 mg/dL — ABNORMAL LOW (ref 8.9–10.3)
Chloride: 104 mmol/L (ref 98–111)
Creatinine, Ser: 0.81 mg/dL (ref 0.44–1.00)
GFR, Estimated: >60 mL/min (ref >60)
Glucose, Bld: 110 mg/dL — ABNORMAL HIGH (ref 70–99)
Potassium: 3.3 mmol/L — ABNORMAL LOW (ref 3.5–5.1)
Sodium: 135 mmol/L (ref 135–145)

## 2022-03-29 LAB — CBC WITH DIFFERENTIAL/PLATELET
Abs Immature Granulocytes: 0.06 10*3/uL (ref 0.00–0.07)
Basophils Absolute: 0 10*3/uL (ref 0.0–0.1)
Basophils Relative: 0 %
Eosinophils Absolute: 0.1 10*3/uL (ref 0.0–0.5)
Eosinophils Relative: 1 %
HCT: 36.6 % (ref 36.0–46.0)
Hemoglobin: 12.2 g/dL (ref 12.0–15.0)
Immature Granulocytes: 1 %
Lymphocytes Relative: 13 %
Lymphs Abs: 1.4 10*3/uL (ref 0.7–4.0)
MCH: 32.9 pg (ref 26.0–34.0)
MCHC: 33.3 g/dL (ref 30.0–36.0)
MCV: 98.7 fL (ref 80.0–100.0)
Monocytes Absolute: 0.7 10*3/uL (ref 0.1–1.0)
Monocytes Relative: 7 %
Neutro Abs: 7.9 10*3/uL — ABNORMAL HIGH (ref 1.7–7.7)
Neutrophils Relative %: 78 %
Platelets: 286 10*3/uL (ref 150–400)
RBC: 3.71 MIL/uL — ABNORMAL LOW (ref 3.87–5.11)
RDW: 13.2 % (ref 11.5–15.5)
WBC: 10.1 10*3/uL (ref 4.0–10.5)
nRBC: 0 % (ref 0.0–0.2)

## 2022-03-29 LAB — MAGNESIUM: Magnesium: 1.5 mg/dL — ABNORMAL LOW (ref 1.7–2.4)

## 2022-03-29 MED ORDER — MAGNESIUM SULFATE 4 GM/100ML IV SOLN
4.0000 g | Freq: Once | INTRAVENOUS | Status: AC
  Administered 2022-03-29: 4 g via INTRAVENOUS
  Filled 2022-03-29: qty 100

## 2022-03-29 MED ORDER — POTASSIUM CHLORIDE CRYS ER 20 MEQ PO TBCR
40.0000 meq | EXTENDED_RELEASE_TABLET | Freq: Once | ORAL | Status: AC
  Administered 2022-03-29: 40 meq via ORAL
  Filled 2022-03-29: qty 2

## 2022-03-29 NOTE — Progress Notes (Signed)
Progress Note    Veronica Wood   OYD:[id number]  DOB: 05/30/1951  DOA: 03/26/2022     3 PCP: Deland Pretty, MD  Initial CC:   Hospital Course: Veronica Wood is a 71 yo female with PMH anxiety, HTN, HLD, depression, thyroid disease who was brought into the hospital due to confusion, feeling drowsy, and falling out of chair at home.  She also endorsed urinary retention and burning for several days prior to admission.  She had some report of dysarthria during work-up and code stroke was activated.  CT head was negative for acute changes.  CT head did show progression of underlying cerebral atrophy and chronic white matter changes since 2015. She was recently started on amantadine on 02/24/2022 for a 30-day supply for trial of suppressing tremors (no significant improvement per husband).  No other notable changes in medications in terms of new meds recently.  UDS was negative.  Urinalysis negative for signs of infection.  TSH normal, ammonia negative. She was admitted for further encephalopathy work-up.  Interval History:  No events overnight.  Mentation still far confused but this appears to be her probable baseline.  Appetite remains adequate.  Foley remains in place.  SNF still planned at discharge.  Assessment and Plan: * Acute metabolic encephalopathy - apparent acute presentation; per husband her baseline is typically alert and oriented. He also endorses she has episodes like this occasionally - normal TSH and NH3 - negative UDS and UA - differential includes medication side effect (possible from amantadine? vs recent trial of inc dose of seroquel) vs conversion vs underlying significant cerebral atrophy (dementia) vs vitamin/nutrient deficiency - MRI brain 9/28 negative for stroke and shows advanced atrophy which I've explained can contribute to demential-like behavior - also low B12 and folate levels: started on supplementation - continue thiamine; follow up B1 level which will  typically take several days to return  Acute urinary retention - Continued to have high PVRs - Etiology possibly in setting of her in acute encephalopathy - foley placed on 9/28; will need TOV again prior to discharge if regains more mentation and strength improvement vs leaving foley in longer and attempt after more re-conditioning  AKI (acute kidney injury) ([place]) - baseline creatinine ~ 0.8 - patient presents with increase in creat >0.3 mg/dL above baseline, creat increase >1.5x baseline presumed to have occurred within past 7 days PTA -Suspected prerenal for now vs due to urinary retention  - some improvement with fluids but still retaining urine; see urinary retention -Continue diet.  Okay to stop fluids.  Continue Foley  Hypothyroidism - TSH normal, 0.665 - Continue Synthroid  Hypertension - Continue amlodipine, atenolol, losartan  Hyperlipidemia - Hold statin for now  Coronary atherosclerosis due to calcified coronary lesion - evaluated outpatient by cardiology, last seen by Dr. Terri Skains on 02/21/2021 -Patient recommended to continue on aspirin and statin   Old records reviewed in assessment of this patient  Antimicrobials:   DVT prophylaxis:  enoxaparin (LOVENOX) injection 40 mg Start: 03/27/22 1000   Code Status:   Code Status: Full Code  Mobility Assessment (last 72 hours)     Mobility Assessment     Row Name 03/28/22 2030 03/28/22 1217 03/27/22 2138 03/27/22 1600     Does patient have an order for bedrest or is patient medically unstable -- -- No - Continue assessment No - Continue assessment    What is the highest level of mobility based on the progressive mobility assessment? Level 5 (Walks with assist  in room/hall) - Balance while stepping forward/back and can walk in room with assist - Complete Level 5 (Walks with assist in room/hall) - Balance while stepping forward/back and can walk in room with assist - Complete Level 2 (Chairfast) - Balance while sitting  on edge of bed and cannot stand Level 2 (Chairfast) - Balance while sitting on edge of bed and cannot stand    Is the above level different from baseline mobility prior to current illness? -- -- Yes - Recommend PT order Yes - Recommend PT order             Barriers to discharge: none Disposition Plan: SNF Status is: Inpt  Objective: Blood pressure (!) 162/90, pulse 84, temperature 98.5 F (36.9 C), temperature source Oral, resp. rate 18, height 5\' 6"  (1.676 m), weight 72 kg, SpO2 97 %.  Examination:  Physical Exam Constitutional:      General: She is not in acute distress.    Appearance: She is not ill-appearing.     Comments: Greatly improved mentation.  HENT:     Head: Normocephalic and atraumatic.     Mouth/Throat:     Mouth: Mucous membranes are moist.  Eyes:     Extraocular Movements: Extraocular movements intact.  Cardiovascular:     Rate and Rhythm: Normal rate and regular rhythm.  Pulmonary:     Effort: Pulmonary effort is normal.     Breath sounds: Normal breath sounds.  Abdominal:     General: Bowel sounds are normal. There is no distension.     Palpations: Abdomen is soft.     Tenderness: There is no abdominal tenderness.  Musculoskeletal:        General: Normal range of motion.     Cervical back: Normal range of motion and neck supple.  Skin:    General: Skin is warm and dry.  Neurological:     Mental Status: She is alert.     Comments: AO x 3. Easily following commands      Consultants:    Procedures:    Data Reviewed: Results for orders placed or performed during the hospital encounter of 03/26/22 (from the past 24 hour(s))  Basic metabolic panel     Status: Abnormal   Collection Time: 03/29/22  6:11 AM  Result Value Ref Range   Sodium 135 135 - 145 mmol/L   Potassium 3.3 (L) 3.5 - 5.1 mmol/L   Chloride 104 98 - 111 mmol/L   CO2 24 22 - 32 mmol/L   Glucose, Bld 110 (H) 70 - 99 mg/dL   BUN 12 8 - 23 mg/dL   Creatinine, Ser 03/31/22 0.44 - 1.00  mg/dL   Calcium 8.7 (L) 8.9 - 10.3 mg/dL   GFR, Estimated 9.52 >84 mL/min   Anion gap 7 5 - 15  CBC with Differential/Platelet     Status: Abnormal   Collection Time: 03/29/22  6:11 AM  Result Value Ref Range   WBC 10.1 4.0 - 10.5 K/uL   RBC 3.71 (L) 3.87 - 5.11 MIL/uL   Hemoglobin 12.2 12.0 - 15.0 g/dL   HCT 03/31/22 24.4 - 01.0 %   MCV 98.7 80.0 - 100.0 fL   MCH 32.9 26.0 - 34.0 pg   MCHC 33.3 30.0 - 36.0 g/dL   RDW 27.2 53.6 - 64.4 %   Platelets 286 150 - 400 K/uL   nRBC 0.0 0.0 - 0.2 %   Neutrophils Relative % 78 %   Neutro Abs 7.9 (H) 1.7 -  7.7 K/uL   Lymphocytes Relative 13 %   Lymphs Abs 1.4 0.7 - 4.0 K/uL   Monocytes Relative 7 %   Monocytes Absolute 0.7 0.1 - 1.0 K/uL   Eosinophils Relative 1 %   Eosinophils Absolute 0.1 0.0 - 0.5 K/uL   Basophils Relative 0 %   Basophils Absolute 0.0 0.0 - 0.1 K/uL   Immature Granulocytes 1 %   Abs Immature Granulocytes 0.06 0.00 - 0.07 K/uL  Magnesium     Status: Abnormal   Collection Time: 03/29/22  6:11 AM  Result Value Ref Range   Magnesium 1.5 (L) 1.7 - 2.4 mg/dL     I have Reviewed nursing notes, Vitals, and Lab results since pt's last encounter. Pertinent lab results : see above I have ordered test including BMP, CBC, Mg I have reviewed the last note from staff over past 24 hours I have discussed pt's care plan and test results with nursing staff, case manager   LOS: 3 days   Lewie Chamber, MD [facility] 03/29/2022, 3:47 PM

## 2022-03-30 DIAGNOSIS — G9341 Metabolic encephalopathy: Secondary | ICD-10-CM | POA: Diagnosis not present

## 2022-03-30 DIAGNOSIS — R338 Other retention of urine: Secondary | ICD-10-CM | POA: Diagnosis not present

## 2022-03-30 LAB — AMMONIA: Ammonia: 20 umol/L (ref 9–35)

## 2022-03-30 NOTE — Progress Notes (Signed)
Progress Note    Veronica Wood   UXL:[id number]  DOB: [birth year]  DOA: 03/26/2022     4 PCP: Merri Brunette, MD  Initial CC: Confusion  Hospital Course: Ms. Veronica Wood is a 71 yo female with PMH anxiety, HTN, HLD, depression, thyroid disease who was brought into the hospital due to confusion, feeling drowsy, and falling out of chair at home.  She also endorsed urinary retention and burning for several days prior to admission.  She had some report of dysarthria during work-up and code stroke was activated.  CT head was negative for acute changes.  CT head did show progression of underlying cerebral atrophy and chronic white matter changes since 2015. She was recently started on amantadine on 02/24/2022 for a 30-day supply for trial of suppressing tremors (no significant improvement per husband).  No other notable changes in medications in terms of new meds recently.  UDS was negative.  Urinalysis negative for signs of infection.  TSH normal, ammonia negative. She was admitted for further encephalopathy work-up.  Interval History:  No events overnight.  Still remains somewhat confused.  Slightly worse today.  Now having a sitter bedside due to impulsiveness and trying to get out of bed.  Assessment and Plan: * Acute metabolic encephalopathy - apparent acute presentation; per husband her baseline is typically alert and oriented. He also endorses she has episodes like this occasionally - normal TSH and NH3 - negative UDS and UA - differential includes medication side effect (possible from amantadine? vs recent trial of inc dose of seroquel) vs conversion vs underlying significant cerebral atrophy (dementia) vs vitamin/nutrient deficiency - MRI brain 9/28 negative for stroke and shows advanced atrophy which I've explained can contribute to demential-like behavior - also low B12 and folate levels: started on supplementation - continue thiamine; B1 level normal -Repeat NH3 due to ongoing  confusion.  Still normal  Acute urinary retention - Continued to have high PVRs - Etiology possibly in setting of her in acute encephalopathy - foley placed on 9/28; will need TOV again prior to discharge if regains more mentation and strength improvement vs leaving foley in longer and attempt after more re-conditioning  AKI (acute kidney injury) (HCC)-resolved as of 03/30/2022 - baseline creatinine ~ 0.8 - patient presents with increase in creat >0.3 mg/dL above baseline, creat increase >1.5x baseline presumed to have occurred within past 7 days PTA -Suspected prerenal for now vs due to urinary retention  - some improvement with fluids but still retaining urine; see urinary retention -Continue diet.  Okay to stop fluids.  Continue Foley  Hypothyroidism - TSH normal, 0.665 - Continue Synthroid  Hypertension - Continue amlodipine, atenolol, losartan  Hyperlipidemia - Hold statin for now  Coronary atherosclerosis due to calcified coronary lesion - evaluated outpatient by cardiology, last seen by Dr. Odis Hollingshead on 02/21/2021 -Patient recommended to continue on aspirin and statin   Old records reviewed in assessment of this patient  Antimicrobials:   DVT prophylaxis:  enoxaparin (LOVENOX) injection 40 mg Start: 03/27/22 1000   Code Status:   Code Status: Full Code  Mobility Assessment (last 72 hours)     Mobility Assessment     Row Name 03/30/22 1500 03/30/22 1003 03/28/22 2030 03/28/22 1217 03/27/22 2138   Does patient have an order for bedrest or is patient medically unstable -- No - Continue assessment -- -- No - Continue assessment   What is the highest level of mobility based on the progressive mobility assessment? Level 4 (Walks with assist  in room) - Balance while marching in place and cannot step forward and back - Complete Level 2 (Chairfast) - Balance while sitting on edge of bed and cannot stand Level 5 (Walks with assist in room/hall) - Balance while stepping  forward/back and can walk in room with assist - Complete Level 5 (Walks with assist in room/hall) - Balance while stepping forward/back and can walk in room with assist - Complete Level 2 (Chairfast) - Balance while sitting on edge of bed and cannot stand   Is the above level different from baseline mobility prior to current illness? -- Yes - Recommend PT order -- -- Yes - Recommend PT order            Barriers to discharge: none Disposition Plan: SNF Status is: Inpt  Objective: Blood pressure (!) 169/88, pulse 79, temperature 98.9 F (37.2 C), temperature source Oral, resp. rate 17, height 5\' 6"  (1.676 m), weight 72 kg, SpO2 97 %.  Examination:  Physical Exam Constitutional:      General: She is not in acute distress.    Appearance: She is not ill-appearing.     Comments: Mentation worsened some today.  HENT:     Head: Normocephalic and atraumatic.     Mouth/Throat:     Mouth: Mucous membranes are moist.  Eyes:     Extraocular Movements: Extraocular movements intact.  Cardiovascular:     Rate and Rhythm: Normal rate and regular rhythm.  Pulmonary:     Effort: Pulmonary effort is normal.     Breath sounds: Normal breath sounds.  Abdominal:     General: Bowel sounds are normal. There is no distension.     Palpations: Abdomen is soft.     Tenderness: There is no abdominal tenderness.  Musculoskeletal:        General: Normal range of motion.     Cervical back: Normal range of motion and neck supple.  Skin:    General: Skin is warm and dry.  Neurological:     Mental Status: She is alert.     Comments: Follows commands and moves all 4 extremities but more confused today      Consultants:    Procedures:    Data Reviewed: Results for orders placed or performed during the hospital encounter of 03/26/22 (from the past 24 hour(s))  Ammonia     Status: None   Collection Time: 03/30/22 10:11 AM  Result Value Ref Range   Ammonia 20 9 - 35 umol/L     I have Reviewed  nursing notes, Vitals, and Lab results since pt's last encounter. Pertinent lab results : see above I have ordered test including BMP, CBC, Mg I have reviewed the last note from staff over past 24 hours I have discussed pt's care plan and test results with nursing staff, case manager   LOS: 4 days   Dwyane Dee, MD [facility] 03/30/2022, 4:12 PM

## 2022-03-30 NOTE — Evaluation (Signed)
Occupational Therapy Evaluation Patient Details Name: Veronica Wood MRN: [id number] DOB: May 20, 1951 Today's Date: 03/30/2022   History of Present Illness 71 yo female with PMH anxiety, HTN, HLD, depression, thyroid disease who was brought into the hospital due to confusion, feeling drowsy, and falling out of chair at home and admitted 03/26/22 for Acute metabolic encephalopathy.  "MRI brain 9/28 negative for stroke and shows advanced atrophy which I've explained can contribute to demential-like behavior" per MD note   Clinical Impression   Pt admitted with the above. Pt currently with functional limitations due to the deficits listed below (see OT Problem List).  Pt will benefit from skilled OT to increase their safety and independence with ADL and functional mobility for ADL to facilitate discharge to venue listed below.        Recommendations for follow up therapy are one component of a multi-disciplinary discharge planning process, led by the attending physician.  Recommendations may be updated based on patient status, additional functional criteria and insurance authorization.   Follow Up Recommendations  Skilled nursing-short term rehab (<3 hours/day)    Assistance Recommended at Discharge    Patient can return home with the following A little help with walking and/or transfers;A little help with bathing/dressing/bathroom    Functional Status Assessment  Patient has had a recent decline in their functional status and demonstrates the ability to make significant improvements in function in a reasonable and predictable amount of time.  Equipment Recommendations  None recommended by OT       Precautions / Restrictions Precautions Precautions: Fall      Mobility Bed Mobility Overal bed mobility: Needs Assistance Bed Mobility: Supine to Sit     Supine to sit: Min assist          Transfers   Equipment used: Rolling walker (2 wheels) Transfers: Sit to/from Stand Sit to  Stand: Min assist                  Balance Overall balance assessment: History of Falls, Needs assistance         Standing balance support: Bilateral upper extremity supported, Reliant on assistive device for balance Standing balance-Leahy Scale: Poor                             ADL either performed or assessed with clinical judgement   ADL Overall ADL's : Needs assistance/impaired Eating/Feeding: Set up;Sitting   Grooming: Set up;Sitting   Upper Body Bathing: Set up;Sitting   Lower Body Bathing: Minimal assistance;Sit to/from stand   Upper Body Dressing : Set up;Sitting   Lower Body Dressing: Minimal assistance;Sit to/from stand   Toilet Transfer: Minimal assistance;Stand-pivot;Regular Toilet;Cueing for safety;Cueing for sequencing   Toileting- Clothing Manipulation and Hygiene: Minimal assistance;Sit to/from stand;Cueing for safety;Cueing for sequencing;Cueing for compensatory techniques         General ADL Comments: pt continues with confusion but is appropriate with OT following directions and participating     Vision Patient Visual Report: No change from baseline              Pertinent Vitals/Pain Pain Assessment Pain Assessment: No/denies pain     Extremity/Trunk Assessment Upper Extremity Assessment Upper Extremity Assessment: Generalized weakness           Communication Communication Communication: No difficulties   Cognition Arousal/Alertness: Awake/alert Behavior During Therapy: WFL for tasks assessed/performed Overall Cognitive Status: History of cognitive impairments - at baseline  General Comments: possible dementia, MRI: advanced atrophy     General Comments               Home Living Family/patient expects to be discharged to:: Private residence Living Arrangements: Spouse/significant other   Type of Home: House       Home Layout: Two level;Bed/bath  upstairs;1/2 bath on main level Alternate Level Stairs-Number of Steps: 16 Alternate Level Stairs-Rails: Right           Home Equipment: Rolling Walker (2 wheels)          Prior Functioning/Environment Prior Level of Function : Independent/Modified Independent             Mobility Comments: was ambulatory without assistive device, reports lately crawling to get around if spouse is unable to assist ADLs Comments: spouse reports they just returned from the beach house, spouse was able to perform stairs and ambulate until a few day prior to admission        OT Problem List: Decreased strength;Decreased knowledge of use of DME or AE;Impaired balance (sitting and/or standing);Decreased safety awareness      OT Treatment/Interventions: Self-care/ADL training;Patient/family education;DME and/or AE instruction    OT Goals(Current goals can be found in the care plan section) Acute Rehab OT Goals Patient Stated Goal: get well OT Goal Formulation: With patient Time For Goal Achievement: 04/13/22 Potential to Achieve Goals: Good ADL Goals Pt Will Perform Grooming: with modified independence;standing Pt Will Perform Lower Body Bathing: with modified independence;sit to/from stand Pt Will Perform Lower Body Dressing: with modified independence;sit to/from stand Pt Will Transfer to Toilet: with modified independence;regular height toilet Pt Will Perform Toileting - Clothing Manipulation and hygiene: with modified independence;sit to/from stand  OT Frequency: Min 2X/week       AM-PAC OT "6 Clicks" Daily Activity     Outcome Measure Help from another person eating meals?: None Help from another person taking care of personal grooming?: A Little Help from another person toileting, which includes using toliet, bedpan, or urinal?: A Little Help from another person bathing (including washing, rinsing, drying)?: A Little Help from another person to put on and taking off regular upper  body clothing?: None Help from another person to put on and taking off regular lower body clothing?: A Little 6 Click Score: 20   End of Session Equipment Utilized During Treatment: Rolling walker (2 wheels) Nurse Communication: Mobility status  Activity Tolerance: Patient tolerated treatment well Patient left: in chair;with call bell/phone within reach;with nursing/sitter in room  OT Visit Diagnosis: Unsteadiness on feet (R26.81);Muscle weakness (generalized) (M62.81)                Time: 6834-1962 OT Time Calculation (min): 31 min Charges:  OT General Charges $OT Visit: 1 Visit OT Evaluation $OT Eval Low Complexity: 1 Low OT Treatments $Self Care/Home Management : 8-22 mins  Kari Baars, OT Acute Rehabilitation Services [phone] Office- [phone], Edwena Felty D 03/30/2022, 3:46 PM

## 2022-03-31 ENCOUNTER — Inpatient Hospital Stay (HOSPITAL_COMMUNITY)
Admit: 2022-03-31 | Discharge: 2022-03-31 | Disposition: A | Discharge status: Home / Self Care | Payer: Medicare Other | Attending: Internal Medicine | Admitting: Internal Medicine

## 2022-03-31 DIAGNOSIS — R4182 Altered mental status, unspecified: Secondary | ICD-10-CM

## 2022-03-31 DIAGNOSIS — R338 Other retention of urine: Secondary | ICD-10-CM | POA: Diagnosis not present

## 2022-03-31 DIAGNOSIS — G9341 Metabolic encephalopathy: Secondary | ICD-10-CM | POA: Diagnosis not present

## 2022-03-31 LAB — MAGNESIUM: Magnesium: 1.9 mg/dL (ref 1.7–2.4)

## 2022-03-31 LAB — CBC WITH DIFFERENTIAL/PLATELET
Abs Immature Granulocytes: 0.04 10*3/uL (ref 0.00–0.07)
Basophils Absolute: 0 10*3/uL (ref 0.0–0.1)
Basophils Relative: 1 %
Eosinophils Absolute: 0.2 10*3/uL (ref 0.0–0.5)
Eosinophils Relative: 2 %
HCT: 39.2 % (ref 36.0–46.0)
Hemoglobin: 13 g/dL (ref 12.0–15.0)
Immature Granulocytes: 1 %
Lymphocytes Relative: 25 %
Lymphs Abs: 2.1 10*3/uL (ref 0.7–4.0)
MCH: 32.7 pg (ref 26.0–34.0)
MCHC: 33.2 g/dL (ref 30.0–36.0)
MCV: 98.5 fL (ref 80.0–100.0)
Monocytes Absolute: 0.8 10*3/uL (ref 0.1–1.0)
Monocytes Relative: 9 %
Neutro Abs: 5.3 10*3/uL (ref 1.7–7.7)
Neutrophils Relative %: 62 %
Platelets: 394 10*3/uL (ref 150–400)
RBC: 3.98 MIL/uL (ref 3.87–5.11)
RDW: 13 % (ref 11.5–15.5)
WBC: 8.4 10*3/uL (ref 4.0–10.5)
nRBC: 0 % (ref 0.0–0.2)

## 2022-03-31 LAB — BASIC METABOLIC PANEL
Anion gap: 6 (ref 5–15)
BUN: 10 mg/dL (ref 8–23)
CO2: 26 mmol/L (ref 22–32)
Calcium: 9.1 mg/dL (ref 8.9–10.3)
Chloride: 102 mmol/L (ref 98–111)
Creatinine, Ser: 0.76 mg/dL (ref 0.44–1.00)
GFR, Estimated: >60 mL/min (ref >60)
Glucose, Bld: 107 mg/dL — ABNORMAL HIGH (ref 70–99)
Potassium: 3.9 mmol/L (ref 3.5–5.1)
Sodium: 134 mmol/L — ABNORMAL LOW (ref 135–145)

## 2022-03-31 MED ORDER — LAMOTRIGINE 25 MG PO TABS
150.0000 mg | ORAL_TABLET | Freq: Every day | ORAL | Status: DC
  Administered 2022-03-31 – 2022-04-03 (×4): 150 mg via ORAL
  Filled 2022-03-31 (×4): qty 2

## 2022-03-31 MED ORDER — QUETIAPINE FUMARATE 200 MG PO TABS
800.0000 mg | ORAL_TABLET | Freq: Every day | ORAL | Status: DC
  Administered 2022-03-31 – 2022-04-02 (×3): 800 mg via ORAL
  Filled 2022-03-31 (×2): qty 4
  Filled 2022-03-31: qty 8

## 2022-03-31 MED ORDER — DULOXETINE HCL 60 MG PO CPEP
60.0000 mg | ORAL_CAPSULE | Freq: Every day | ORAL | Status: DC
  Administered 2022-03-31 – 2022-04-03 (×4): 60 mg via ORAL
  Filled 2022-03-31: qty 2
  Filled 2022-03-31: qty 1
  Filled 2022-03-31: qty 2
  Filled 2022-03-31: qty 1

## 2022-03-31 NOTE — Progress Notes (Signed)
EEG complete - results pending 

## 2022-03-31 NOTE — TOC Initial Note (Addendum)
Transition of Care [facility]) - Initial/Assessment Note    Patient Details  Name: Veronica Wood MRN: [id number] Date of Birth: 1950/10/19  Transition of Care [facility]) CM/SW Contact:    Vassie Moselle, LCSW Phone Number: 03/31/2022, 12:47 PM  Clinical Narrative:                 CSW spoke with pt's spouse, Arshi Duarte and confirmed plan for SNF placement. Mr Milburn shares that this pt has never been to SNF before. He is agreeable to referrals being faxed out.  Pt's PASRR is currently pending and will need approval number before she can transfer to SNF.  FL-2 has been completed and referrals have been sent out for SNF placement.    Update 1:40pm- Pt's PASRR has been assigned. PASRR# [id number] A.   Expected Discharge Plan: Skilled Nursing Facility Barriers to Discharge: Continued Medical Work up   Patient Goals and CMS Choice Patient states their goals for this hospitalization and ongoing recovery are:: To return home CMS Medicare.gov Compare Post Acute Care list provided to:: Patient Represenative (must comment) (Spouse, Deborra Medina) Choice offered to / list presented to : Spouse  Expected Discharge Plan and Services Expected Discharge Plan: [place] In-house Referral: NA Discharge Planning Services: CM Consult Post Acute Care Choice: [place] Living arrangements for the past 2 months: Single Family Home                 DME Arranged: N/A DME Agency: NA                  Prior Living Arrangements/Services Living arrangements for the past 2 months: Single Family Home Lives with:: Spouse Patient language and need for interpreter reviewed:: Yes Do you feel safe going back to the place where you live?: Yes      Need for Family Participation in Patient Care: Yes (Comment) Care giver support system in place?: No (comment) Current home services: DME Criminal Activity/Legal Involvement Pertinent to Current Situation/Hospitalization: No -  Comment as needed  Activities of Daily Living Home Assistive Devices/Equipment: Cane (specify quad or straight), Eyeglasses ADL Screening (condition at time of admission) Patient's cognitive ability adequate to safely complete daily activities?: No Is the patient deaf or have difficulty hearing?: No Does the patient have difficulty seeing, even when wearing glasses/contacts?: No Does the patient have difficulty concentrating, remembering, or making decisions?: Yes Patient able to express need for assistance with ADLs?: Yes Does the patient have difficulty dressing or bathing?: Yes Independently performs ADLs?: No Communication: Independent Dressing (OT): Needs assistance Is this a change from baseline?: Pre-admission baseline Grooming: Needs assistance Is this a change from baseline?: Pre-admission baseline Feeding: Independent Bathing: Needs assistance Is this a change from baseline?: Pre-admission baseline Toileting: Needs assistance Is this a change from baseline?: Pre-admission baseline In/Out Bed: Needs assistance Is this a change from baseline?: Pre-admission baseline Walks in Home: Needs assistance Is this a change from baseline?: Pre-admission baseline Does the patient have difficulty walking or climbing stairs?: Yes Weakness of Legs: Both Weakness of Arms/Hands: Both  Permission Sought/Granted Permission sought to share information with : Facility Sport and exercise psychologist, Family Supports Permission granted to share information with : No              Emotional Assessment Appearance:: Other (Comment Required (Did not meet with pt face to face) Attitude/Demeanor/Rapport: Unable to Assess Affect (typically observed): Unable to Assess Orientation: : Oriented to Self Alcohol / Substance Use: Not Applicable Psych Involvement:  No (comment)  Admission diagnosis:  Encephalopathy [id number] Acute metabolic encephalopathy [id number] Patient Active Problem List   Diagnosis  Date Noted   Acute urinary retention 123456   Acute metabolic encephalopathy 123456   Coronary atherosclerosis due to calcified coronary lesion    Hyperlipidemia    Hypertension    Hypothyroidism    PCP:  Deland Pretty, MD Pharmacy:   [id number] - [place], [place] [address] [place] [address] Phone: [phone] Fax: [phone]     Social Determinants of Health (SDOH) Interventions    Readmission Risk Interventions    03/31/2022   12:43 PM  Readmission Risk Prevention Plan  Post Dischage Appt Complete  Medication Screening Complete  Transportation Screening Complete

## 2022-03-31 NOTE — Progress Notes (Signed)
Physical Therapy Treatment Patient Details Name: Veronica Wood MRN: [id number] DOB: July 02, 1950 Today's Date: 03/31/2022   History of Present Illness 71 yo female with PMH anxiety, HTN, HLD, depression, thyroid disease who was brought into the hospital due to confusion, feeling drowsy, and falling out of chair at home and admitted 03/26/22 for Acute metabolic encephalopathy.  "MRI brain 9/28 negative for stroke and shows advanced atrophy which I've explained can contribute to demential-like behavior" per MD note    PT Comments    Pt assisted with ambulating in hallway and appears more steady and tolerated improved distance.  Pt still requiring min assist occasionally for stability with challenges.  Current plan is for d/c to SNF.   Recommendations for follow up therapy are one component of a multi-disciplinary discharge planning process, led by the attending physician.  Recommendations may be updated based on patient status, additional functional criteria and insurance authorization.  Follow Up Recommendations  Skilled nursing-short term rehab (<3 hours/day) Can patient physically be transported by private vehicle: Yes   Assistance Recommended at Discharge Frequent or constant Supervision/Assistance  Patient can return home with the following A little help with walking and/or transfers;A little help with bathing/dressing/bathroom;Help with stairs or ramp for entrance;Assistance with cooking/housework;Assist for transportation   Equipment Recommendations  None recommended by PT    Recommendations for Other Services       Precautions / Restrictions Precautions Precautions: Fall     Mobility  Bed Mobility Overal bed mobility: Needs Assistance Bed Mobility: Supine to Sit     Supine to sit: Min guard     General bed mobility comments: increased time    Transfers Overall transfer level: Needs assistance Equipment used: Rolling walker (2 wheels) Transfers: Sit to/from  Stand Sit to Stand: Min assist           General transfer comment: light assist to steady with rise, cues for hand placement    Ambulation/Gait Ambulation/Gait assistance: Min assist Gait Distance (Feet): 160 Feet Assistive device: Rolling walker (2 wheels) Gait Pattern/deviations: Step-through pattern, Decreased stride length       General Gait Details: verbal cues for use of UEs through RW for steadying, assist for stabilizing with avoiding obstacles, improved stability with straight path today   Stairs             Wheelchair Mobility    Modified Rankin (Stroke Patients Only)       Balance Overall balance assessment: History of Falls, Needs assistance         Standing balance support: Bilateral upper extremity supported, Reliant on assistive device for balance Standing balance-Leahy Scale: Poor                              Cognition Arousal/Alertness: Awake/alert Behavior During Therapy: WFL for tasks assessed/performed Overall Cognitive Status: History of cognitive impairments - at baseline                                 General Comments: possible dementia, MRI: advanced atrophy        Exercises      General Comments        Pertinent Vitals/Pain      Home Living                          Prior Function  PT Goals (current goals can now be found in the care plan section) Progress towards PT goals: Progressing toward goals    Frequency    Min 2X/week      PT Plan Current plan remains appropriate    Co-evaluation              AM-PAC PT "6 Clicks" Mobility   Outcome Measure  Help needed turning from your back to your side while in a flat bed without using bedrails?: A Little Help needed moving from lying on your back to sitting on the side of a flat bed without using bedrails?: A Little Help needed moving to and from a bed to a chair (including a wheelchair)?: A Little Help  needed standing up from a chair using your arms (e.g., wheelchair or bedside chair)?: A Little Help needed to walk in hospital room?: A Little Help needed climbing 3-5 steps with a railing? : A Lot 6 Click Score: 17    End of Session Equipment Utilized During Treatment: Gait belt Activity Tolerance: Patient tolerated treatment well Patient left: in chair;with call bell/phone within reach;with nursing/sitter in room Psychiatrist present) Nurse Communication: Mobility status PT Visit Diagnosis: Difficulty in walking, not elsewhere classified (R26.2);Unsteadiness on feet (R26.81)     Time: 5009-3818 PT Time Calculation (min) (ACUTE ONLY): 11 min  Charges:  $Gait Training: 8-22 mins                    Thomasene Mohair PT, DPT Physical Therapist Acute Rehabilitation Services Preferred contact method: Secure Chat Weekend Pager Only: [phone] Office: [phone]    Veronica Wood [place] 03/31/2022, 3:41 PM

## 2022-03-31 NOTE — Plan of Care (Signed)
Called by primary hospitalist-patient continues to be altered.  No clear cause. Recommended getting EEG-read by Dr. Hortense Ramal with concern for triphasics more in the posterior quadrant which could be on ictal interictal continuum. Recommended patient be transferred over to [facility] urgently for LTM. We will see her when she gets here and hook up to LTM.  -- Amie Portland, MD Neurologist [facility] Pager: [phone]

## 2022-03-31 NOTE — Procedures (Signed)
Patient Name: Veronica Wood  MRN: [id number]  Epilepsy Attending: Lora Havens  Referring Physician/Provider: Dwyane Dee, MD  Date: 03/31/2022 Duration: 23.35 mins  Patient history: 71yo f with ams. EEG to evaluate for seizure  Level of alertness: Awake  AEDs during EEG study: Ativan, LTG  Technical aspects: This EEG study was done with scalp electrodes positioned according to the 10-20 International system of electrode placement. Electrical activity was reviewed with band pass filter of 1-70Hz , sensitivity of 7 uV/mm, display speed of 68mm/sec with a 60Hz  notched filter applied as appropriate. EEG data were recorded continuously and digitally stored.  Video monitoring was available and reviewed as appropriate.  Description: No posterior dominant rhythm was seen. EEG showed continuous generalized 3 to 6 Hz theta-delta slowing. Generalized and maximal posterior quadrant periodic discharges with triphasic morphology at 2-2.5Hz  were noted. Hyperventilation and photic stimulation were not performed.     ABNORMALITY - Periodic discharges with triphasic morphology, generalized and maximal posterior quadrant( GPDs) - Continuous slow, generalized  IMPRESSION: This study showed generalized and maximal posterior quadrant periodic discharges with triphasic morphology at  2-2.5Hz . This eeg pattern is on the ictal-interictal continuum but can also be seen due to toxic-metabolic causes. Please consider long term monitoring if clinically indicated.  Additionally there is moderate diffuse encephalopathy, nonspecific etiology. No seizures were seen throughout the recording.  Dr. Rory Percy was notified.  Myriam Brandhorst Barbra Sarks

## 2022-03-31 NOTE — Care Management Important Message (Signed)
Important Message  Patient Details IM Letter placed in Patients room. Name: Veronica Wood MRN: [id number] Date of Birth: 1951-02-05   Medicare Important Message Given:  Yes     Kerin Salen 03/31/2022, 12:03 PM

## 2022-03-31 NOTE — Progress Notes (Signed)
Progress Note    Veronica Wood   NFA:[id number]  DOB: 1950-11-20  DOA: 03/26/2022     5 PCP: Merri Brunette, MD  Initial CC: Confusion  Hospital Course: Veronica Wood is a 71 yo female with PMH anxiety, HTN, HLD, depression, thyroid disease who was brought into the hospital due to confusion, feeling drowsy, and falling out of chair at home.  She also endorsed urinary retention and burning for several days prior to admission.  She had some report of dysarthria during work-up and code stroke was activated.  CT head was negative for acute changes.  CT head did show progression of underlying cerebral atrophy and chronic white matter changes since 2015. She was recently started on amantadine on 02/24/2022 for a 30-day supply for trial of suppressing tremors (no significant improvement per husband).  No other notable changes in medications in terms of new meds recently.  UDS was negative.  Urinalysis negative for signs of infection.  TSH normal, ammonia negative. She was admitted for further encephalopathy work-up.  Interval History:  Remains confused.  Oriented to pretty much name only.  She hesitates when asked any other questions.  She can follow commands but is slow to respond or follow them at times.  Does not appear to have any photophobia, nuchal rigidity, or overt back pain; slightly tender in her mid thoracic back. Discussed with husband bedside this morning.  Assessment and Plan: * Acute metabolic encephalopathy - apparent acute presentation; per husband her baseline is typically alert and oriented. He also endorses she has episodes like this occasionally - normal TSH and NH3 - negative UDS and UA - differential includes medication side effect (possible from amantadine? vs recent trial of inc dose of seroquel) vs conversion vs underlying significant cerebral atrophy (dementia) vs vitamin/nutrient deficiency - MRI brain 9/28 negative for stroke and shows advanced atrophy which I've  explained can contribute to demential-like behavior - also low B12 and folate levels: started on supplementation - continue thiamine; B1 level normal - repeat NH3 on 10/1 still normal - discussed with neurology for further assistance - check EEG -Repeat NH3 due to ongoing confusion.  Still normal  Acute urinary retention - Continued to have high PVRs - Etiology possibly in setting of her in acute encephalopathy - foley placed on 9/28; will need TOV again prior to discharge if regains more mentation and strength improvement vs leaving foley in longer and attempt after more re-conditioning  AKI (acute kidney injury) (HCC)-resolved as of 03/30/2022 - baseline creatinine ~ 0.8 - patient presents with increase in creat >0.3 mg/dL above baseline, creat increase >1.5x baseline presumed to have occurred within past 7 days PTA -Suspected prerenal for now vs due to urinary retention  - some improvement with fluids but still retaining urine; see urinary retention -Continue diet.  Okay to stop fluids.  Continue Foley  Hypothyroidism - TSH normal, 0.665 - Continue Synthroid  Hypertension - Continue amlodipine, atenolol, losartan  Hyperlipidemia - Hold statin for now  Coronary atherosclerosis due to calcified coronary lesion - evaluated outpatient by cardiology, last seen by Dr. Odis Hollingshead on 02/21/2021 -Patient recommended to continue on aspirin and statin   Old records reviewed in assessment of this patient  Antimicrobials:   DVT prophylaxis:  enoxaparin (LOVENOX) injection 40 mg Start: 03/27/22 1000   Code Status:   Code Status: Full Code  Mobility Assessment (last 72 hours)     Mobility Assessment     Row Name 03/31/22 1539 03/30/22 1500 03/30/22 1003 03/28/22  2030     Does patient have an order for bedrest or is patient medically unstable -- -- No - Continue assessment --    What is the highest level of mobility based on the progressive mobility assessment? Level 5 (Walks with  assist in room/hall) - Balance while stepping forward/back and can walk in room with assist - Complete Level 4 (Walks with assist in room) - Balance while marching in place and cannot step forward and back - Complete Level 2 (Chairfast) - Balance while sitting on edge of bed and cannot stand Level 5 (Walks with assist in room/hall) - Balance while stepping forward/back and can walk in room with assist - Complete    Is the above level different from baseline mobility prior to current illness? -- -- Yes - Recommend PT order --             Barriers to discharge: none Disposition Plan: SNF Status is: Inpt  Objective: Blood pressure (!) 150/90, pulse 84, temperature 100.1 F (37.8 C), temperature source Oral, resp. rate 16, height 5\' 6"  (1.676 m), weight 72 kg, SpO2 100 %.  Examination:  Physical Exam Constitutional:      General: She is not in acute distress.    Appearance: She is not ill-appearing.     Comments: Mentation remains poor  HENT:     Head: Normocephalic and atraumatic.     Mouth/Throat:     Mouth: Mucous membranes are moist.  Eyes:     Extraocular Movements: Extraocular movements intact.     Comments: No photophobia  Cardiovascular:     Rate and Rhythm: Normal rate and regular rhythm.  Pulmonary:     Effort: Pulmonary effort is normal.     Breath sounds: Normal breath sounds.  Abdominal:     General: Bowel sounds are normal. There is no distension.     Palpations: Abdomen is soft.     Tenderness: There is no abdominal tenderness.  Musculoskeletal:        General: Normal range of motion.     Cervical back: Normal range of motion and neck supple. No rigidity.     Comments: Very mild tenderness over mid thoracic spine  Skin:    General: Skin is warm and dry.  Neurological:     Mental Status: She is alert.     Comments: Follows commands and moves all 4 extremities but more confused today      Consultants:  Neurology  Procedures:    Data Reviewed: Results  for orders placed or performed during the hospital encounter of 03/26/22 (from the past 24 hour(s))  CBC with Differential/Platelet     Status: None   Collection Time: 03/31/22  5:03 AM  Result Value Ref Range   WBC 8.4 4.0 - 10.5 K/uL   RBC 3.98 3.87 - 5.11 MIL/uL   Hemoglobin 13.0 12.0 - 15.0 g/dL   HCT 05/31/22 44.3 - 15.4 %   MCV 98.5 80.0 - 100.0 fL   MCH 32.7 26.0 - 34.0 pg   MCHC 33.2 30.0 - 36.0 g/dL   RDW 00.8 67.6 - 19.5 %   Platelets 394 150 - 400 K/uL   nRBC 0.0 0.0 - 0.2 %   Neutrophils Relative % 62 %   Neutro Abs 5.3 1.7 - 7.7 K/uL   Lymphocytes Relative 25 %   Lymphs Abs 2.1 0.7 - 4.0 K/uL   Monocytes Relative 9 %   Monocytes Absolute 0.8 0.1 - 1.0 K/uL   Eosinophils  Relative 2 %   Eosinophils Absolute 0.2 0.0 - 0.5 K/uL   Basophils Relative 1 %   Basophils Absolute 0.0 0.0 - 0.1 K/uL   Immature Granulocytes 1 %   Abs Immature Granulocytes 0.04 0.00 - 0.07 K/uL  Basic metabolic panel     Status: Abnormal   Collection Time: 03/31/22  5:03 AM  Result Value Ref Range   Sodium 134 (L) 135 - 145 mmol/L   Potassium 3.9 3.5 - 5.1 mmol/L   Chloride 102 98 - 111 mmol/L   CO2 26 22 - 32 mmol/L   Glucose, Bld 107 (H) 70 - 99 mg/dL   BUN 10 8 - 23 mg/dL   Creatinine, Ser 0.76 0.44 - 1.00 mg/dL   Calcium 9.1 8.9 - 10.3 mg/dL   GFR, Estimated >60 >60 mL/min   Anion gap 6 5 - 15  Magnesium     Status: None   Collection Time: 03/31/22  5:03 AM  Result Value Ref Range   Magnesium 1.9 1.7 - 2.4 mg/dL     I have Reviewed nursing notes, Vitals, and Lab results since pt's last encounter. Pertinent lab results : see above I have ordered test including BMP, CBC, Mg I have reviewed the last note from staff over past 24 hours I have discussed pt's care plan and test results with nursing staff, case manager   LOS: 5 days   Dwyane Dee, MD [facility] 03/31/2022, 5:11 PM

## 2022-03-31 NOTE — NC FL2 (Signed)
[place] MEDICAID FL2 LEVEL OF CARE SCREENING TOOL     IDENTIFICATION  Patient Name: Veronica Wood Birthdate: 02-25-1951 Sex: female Admission Date (Current Location): 03/26/2022  [facility] and [place] Number:  Producer, television/film/video and Address:  [facility],  [address] [place], [place] [address]      Provider Number: [id number]  Attending Physician Name and Address:  Lewie Chamber, MD  Relative Name and Phone Number:  Myrtie Hawk Classie Weng [phone]    Current Level of Care: Hospital Recommended Level of Care: Skilled Nursing Facility Prior Approval Number:    Date Approved/Denied:   PASRR Number: Pending  Discharge Plan: SNF    Current Diagnoses: Patient Active Problem List   Diagnosis Date Noted   Acute urinary retention 03/27/2022   Acute metabolic encephalopathy 03/26/2022   Coronary atherosclerosis due to calcified coronary lesion    Hyperlipidemia    Hypertension    Hypothyroidism     Orientation RESPIRATION BLADDER Height & Weight     Self  Normal Continent Weight: 158 lb 11.7 oz (72 kg) Height:  5\' 6"  (167.6 cm)  BEHAVIORAL SYMPTOMS/MOOD NEUROLOGICAL BOWEL NUTRITION STATUS      Continent Diet (Regular)  AMBULATORY STATUS COMMUNICATION OF NEEDS Skin   Limited Assist Verbally Normal                       Personal Care Assistance Level of Assistance  Bathing, Feeding, Dressing Bathing Assistance: Limited assistance Feeding assistance: Limited assistance Dressing Assistance: Limited assistance     Functional Limitations Info  Sight, Hearing, Speech Sight Info: Adequate Hearing Info: Adequate Speech Info: Adequate    SPECIAL CARE FACTORS FREQUENCY  PT (By licensed PT), OT (By licensed OT)     PT Frequency: 5x/wk OT Frequency: 5x/wk            Contractures Contractures Info: Not present    Additional Factors Info  Code Status, Allergies, Psychotropic Code Status Info: FULL Allergies Info: Celexa (Citalopram  Hydrobromide) Psychotropic Info: See MAR         Current Medications (03/31/2022):  This is the current hospital active medication list Current Facility-Administered Medications  Medication Dose Route Frequency Provider Last Rate Last Admin   acetaminophen (TYLENOL) tablet 650 mg  650 mg Oral Q6H PRN 05/31/2022, MD   650 mg at 03/30/22 2115   Or   acetaminophen (TYLENOL) suppository 650 mg  650 mg Rectal Q6H PRN 2116, MD       amLODipine (NORVASC) tablet 5 mg  5 mg Oral Daily Lewie Chamber, MD   5 mg at 03/31/22 1146   atenolol (TENORMIN) tablet 25 mg  25 mg Oral BID 05/31/22, MD   25 mg at 03/31/22 1145   Chlorhexidine Gluconate Cloth 2 % PADS 6 each  6 each Topical Daily 05/31/22, MD   6 each at 03/30/22 1001   cyanocobalamin (VITAMIN B12) injection 1,000 mcg  1,000 mcg Subcutaneous Daily 05/30/22, MD   1,000 mcg at 03/30/22 05/30/22   DULoxetine (CYMBALTA) DR capsule 60 mg  60 mg Oral Daily 8182, MD   60 mg at 03/31/22 1147   enoxaparin (LOVENOX) injection 40 mg  40 mg Subcutaneous Q24H 05/31/22, MD   40 mg at 03/31/22 1148   folic acid (FOLVITE) tablet 1 mg  1 mg Oral Daily 05/31/22, MD   1 mg at 03/31/22 1146   lamoTRIgine (LAMICTAL) tablet 150 mg  150 mg Oral  Daily Dwyane Dee, MD   150 mg at 03/31/22 1146   levothyroxine (SYNTHROID) tablet 50 mcg  50 mcg Oral Q0600 Dwyane Dee, MD   50 mcg at 03/31/22 0604   loratadine (CLARITIN) tablet 10 mg  10 mg Oral Daily Raenette Rover, NP   10 mg at 03/31/22 1146   LORazepam (ATIVAN) tablet 0.5 mg  0.5 mg Oral BID Dwyane Dee, MD   0.5 mg at 03/31/22 1146   losartan (COZAAR) tablet 25 mg  25 mg Oral Daily Dwyane Dee, MD   25 mg at 03/31/22 1145   multivitamin with minerals tablet 1 tablet  1 tablet Oral Daily Dwyane Dee, MD   1 tablet at 03/31/22 1146   polyvinyl alcohol (LIQUIFILM TEARS) 1.4 % ophthalmic solution 2 drop  2 drop Both Eyes PRN Raenette Rover, NP        QUEtiapine (SEROQUEL) tablet 800 mg  800 mg Oral QHS Dwyane Dee, MD       sodium chloride flush (NS) 0.9 % injection 3 mL  3 mL Intravenous Eddie Candle, MD   3 mL at 03/30/22 2115   thiamine (VITAMIN B1) tablet 100 mg  100 mg Oral Daily Dwyane Dee, MD   100 mg at 03/31/22 1145     Discharge Medications: Please see discharge summary for a list of discharge medications.  Relevant Imaging Results:  Relevant Lab Results:   Additional Information SSN: [id number]  Vassie Moselle, LCSW

## 2022-04-01 ENCOUNTER — Inpatient Hospital Stay (HOSPITAL_COMMUNITY): Payer: Medicare Other

## 2022-04-01 DIAGNOSIS — R338 Other retention of urine: Secondary | ICD-10-CM | POA: Diagnosis not present

## 2022-04-01 DIAGNOSIS — G9341 Metabolic encephalopathy: Secondary | ICD-10-CM | POA: Diagnosis not present

## 2022-04-01 LAB — GLUCOSE, CAPILLARY: Glucose-Capillary: 110 mg/dL — ABNORMAL HIGH (ref 70–99)

## 2022-04-01 LAB — BASIC METABOLIC PANEL
Anion gap: 8 (ref 5–15)
BUN: 14 mg/dL (ref 8–23)
CO2: 22 mmol/L (ref 22–32)
Calcium: 9 mg/dL (ref 8.9–10.3)
Chloride: 104 mmol/L (ref 98–111)
Creatinine, Ser: 1.07 mg/dL — ABNORMAL HIGH (ref 0.44–1.00)
GFR, Estimated: 56 mL/min — ABNORMAL LOW (ref >60)
Glucose, Bld: 106 mg/dL — ABNORMAL HIGH (ref 70–99)
Potassium: 3.8 mmol/L (ref 3.5–5.1)
Sodium: 134 mmol/L — ABNORMAL LOW (ref 135–145)

## 2022-04-01 LAB — CBC WITH DIFFERENTIAL/PLATELET
Abs Immature Granulocytes: 0.03 10*3/uL (ref 0.00–0.07)
Basophils Absolute: 0 10*3/uL (ref 0.0–0.1)
Basophils Relative: 1 %
Eosinophils Absolute: 0.2 10*3/uL (ref 0.0–0.5)
Eosinophils Relative: 2 %
HCT: 38.4 % (ref 36.0–46.0)
Hemoglobin: 12.6 g/dL (ref 12.0–15.0)
Immature Granulocytes: 1 %
Lymphocytes Relative: 22 %
Lymphs Abs: 1.4 10*3/uL (ref 0.7–4.0)
MCH: 33.2 pg (ref 26.0–34.0)
MCHC: 32.8 g/dL (ref 30.0–36.0)
MCV: 101.1 fL — ABNORMAL HIGH (ref 80.0–100.0)
Monocytes Absolute: 0.7 10*3/uL (ref 0.1–1.0)
Monocytes Relative: 11 %
Neutro Abs: 4.2 10*3/uL (ref 1.7–7.7)
Neutrophils Relative %: 63 %
Platelets: 330 10*3/uL (ref 150–400)
RBC: 3.8 MIL/uL — ABNORMAL LOW (ref 3.87–5.11)
RDW: 13.2 % (ref 11.5–15.5)
WBC: 6.5 10*3/uL (ref 4.0–10.5)
nRBC: 0 % (ref 0.0–0.2)

## 2022-04-01 LAB — MAGNESIUM: Magnesium: 2.1 mg/dL (ref 1.7–2.4)

## 2022-04-01 NOTE — Progress Notes (Signed)
Progress Note    Veronica Wood   AUQ:[id number]  DOB: Mar 26, 1951  DOA: 03/26/2022     6 PCP: Merri Brunette, MD  Initial CC: Confusion  Hospital Course: Veronica Wood is a 71 yo female with PMH anxiety, HTN, HLD, depression, thyroid disease who was brought into the hospital due to confusion, feeling drowsy, and falling out of chair at home.  She also endorsed urinary retention and burning for several days prior to admission.  She had some report of dysarthria during work-up and code stroke was activated.  CT head was negative for acute changes.  CT head did show progression of underlying cerebral atrophy and chronic white matter changes since 2015. She was recently started on amantadine on 02/24/2022 for a 30-day supply for trial of suppressing tremors (no significant improvement per husband).  No other notable changes in medications in terms of new meds recently.  UDS was negative.  Urinalysis negative for signs of infection.  TSH normal, ammonia negative. She was admitted for further encephalopathy work-up.  Interval History:  Remains confused but pleasant. No events overnight. Still impulsive some.   Assessment and Plan: * Acute metabolic encephalopathy - apparent acute presentation; per husband her baseline is typically alert and oriented. He also endorses she has episodes like this occasionally - normal TSH and NH3 - negative UDS and UA - differential includes medication side effect (possible from amantadine? vs recent trial of inc dose of seroquel) vs conversion vs underlying significant cerebral atrophy (dementia) vs vitamin/nutrient deficiency - MRI brain 9/28 negative for stroke and shows advanced atrophy which I've explained can contribute to demential-like behavior - also low B12 and folate levels: started on supplementation - continue thiamine; B1 level normal - repeat NH3 on 10/1 still normal - discussed with neurology for further assistance - EEG somewhat abnormal. Patient  recommended to transfer to [facility] for LTM per neurology  Acute urinary retention - Continued to have high PVRs - Etiology possibly in setting of her in acute encephalopathy - foley placed on 9/28; will need TOV again prior to discharge if regains more mentation and strength improvement vs leaving foley in longer and attempt after more re-conditioning  AKI (acute kidney injury) (HCC)-resolved as of 03/30/2022 - baseline creatinine ~ 0.8 - patient presents with increase in creat >0.3 mg/dL above baseline, creat increase >1.5x baseline presumed to have occurred within past 7 days PTA -Suspected prerenal for now vs due to urinary retention  - some improvement with fluids but still retaining urine; see urinary retention -Continue diet.  Okay to stop fluids.  Continue Foley  Hypothyroidism - TSH normal, 0.665 - Continue Synthroid  Hypertension - Continue amlodipine, atenolol, losartan  Hyperlipidemia - Hold statin for now  Coronary atherosclerosis due to calcified coronary lesion - evaluated outpatient by cardiology, last seen by Dr. Odis Hollingshead on 02/21/2021 -Patient recommended to continue on aspirin and statin   Old records reviewed in assessment of this patient  Antimicrobials:   DVT prophylaxis:  enoxaparin (LOVENOX) injection 40 mg Start: 03/27/22 1000   Code Status:   Code Status: Full Code  Mobility Assessment (last 72 hours)     Mobility Assessment     Row Name 03/31/22 1930 03/31/22 1539 03/30/22 1500 03/30/22 1003     Does patient have an order for bedrest or is patient medically unstable No - Continue assessment -- -- No - Continue assessment    What is the highest level of mobility based on the progressive mobility assessment? Level 5 (Walks  with assist in room/hall) - Balance while stepping forward/back and can walk in room with assist - Complete Level 5 (Walks with assist in room/hall) - Balance while stepping forward/back and can walk in room with assist - Complete Level  4 (Walks with assist in room) - Balance while marching in place and cannot step forward and back - Complete Level 2 (Chairfast) - Balance while sitting on edge of bed and cannot stand    Is the above level different from baseline mobility prior to current illness? Yes - Recommend PT order -- -- Yes - Recommend PT order             Barriers to discharge: none Disposition Plan: SNF Status is: Inpt  Objective: Blood pressure 115/69, pulse 81, temperature 98.1 F (36.7 C), temperature source Oral, resp. rate 18, height 5\' 6"  (1.676 m), weight 72 kg, SpO2 100 %.  Examination:  Physical Exam Constitutional:      General: She is not in acute distress.    Appearance: She is not ill-appearing.     Comments: Mentation remains poor  HENT:     Head: Normocephalic and atraumatic.     Mouth/Throat:     Mouth: Mucous membranes are moist.  Eyes:     Extraocular Movements: Extraocular movements intact.     Comments: No photophobia  Cardiovascular:     Rate and Rhythm: Normal rate and regular rhythm.  Pulmonary:     Effort: Pulmonary effort is normal.     Breath sounds: Normal breath sounds.  Abdominal:     General: Bowel sounds are normal. There is no distension.     Palpations: Abdomen is soft.     Tenderness: There is no abdominal tenderness.  Musculoskeletal:        General: Normal range of motion.     Cervical back: Normal range of motion and neck supple. No rigidity.     Comments: Very mild tenderness over mid thoracic spine  Skin:    General: Skin is warm and dry.  Neurological:     Mental Status: She is alert.     Comments: Follows commands and moves all 4 extremities but more confused today      Consultants:  Neurology  Procedures:    Data Reviewed: Results for orders placed or performed during the hospital encounter of 03/26/22 (from the past 24 hour(s))  CBC with Differential/Platelet     Status: Abnormal   Collection Time: 04/01/22  5:12 AM  Result Value Ref  Range   WBC 6.5 4.0 - 10.5 K/uL   RBC 3.80 (L) 3.87 - 5.11 MIL/uL   Hemoglobin 12.6 12.0 - 15.0 g/dL   HCT 06/01/22 78.2 - 42.3 %   MCV 101.1 (H) 80.0 - 100.0 fL   MCH 33.2 26.0 - 34.0 pg   MCHC 32.8 30.0 - 36.0 g/dL   RDW 53.6 14.4 - 31.5 %   Platelets 330 150 - 400 K/uL   nRBC 0.0 0.0 - 0.2 %   Neutrophils Relative % 63 %   Neutro Abs 4.2 1.7 - 7.7 K/uL   Lymphocytes Relative 22 %   Lymphs Abs 1.4 0.7 - 4.0 K/uL   Monocytes Relative 11 %   Monocytes Absolute 0.7 0.1 - 1.0 K/uL   Eosinophils Relative 2 %   Eosinophils Absolute 0.2 0.0 - 0.5 K/uL   Basophils Relative 1 %   Basophils Absolute 0.0 0.0 - 0.1 K/uL   Immature Granulocytes 1 %   Abs Immature Granulocytes  0.03 0.00 - 0.07 K/uL  Basic metabolic panel     Status: Abnormal   Collection Time: 04/01/22  5:12 AM  Result Value Ref Range   Sodium 134 (L) 135 - 145 mmol/L   Potassium 3.8 3.5 - 5.1 mmol/L   Chloride 104 98 - 111 mmol/L   CO2 22 22 - 32 mmol/L   Glucose, Bld 106 (H) 70 - 99 mg/dL   BUN 14 8 - 23 mg/dL   Creatinine, Ser 1.07 (H) 0.44 - 1.00 mg/dL   Calcium 9.0 8.9 - 10.3 mg/dL   GFR, Estimated 56 (L) >60 mL/min   Anion gap 8 5 - 15  Magnesium     Status: None   Collection Time: 04/01/22  5:12 AM  Result Value Ref Range   Magnesium 2.1 1.7 - 2.4 mg/dL     I have Reviewed nursing notes, Vitals, and Lab results since pt's last encounter. Pertinent lab results : see above I have ordered test including BMP, CBC, Mg I have reviewed the last note from staff over past 24 hours I have discussed pt's care plan and test results with nursing staff, case manager   LOS: 6 days   Dwyane Dee, MD [facility] 04/01/2022, 2:39 PM

## 2022-04-01 NOTE — Progress Notes (Signed)
Spoke with Dr. Rory Percy and he said patient will transfer today. Patient assigned bed on 3W

## 2022-04-01 NOTE — Progress Notes (Signed)
Reached out to RN. Pt is still waiting on bed at [facility].

## 2022-04-01 NOTE — Progress Notes (Signed)
Mobility Specialist - Progress Note   04/01/22 1043  Mobility  Activity Ambulated with assistance in hallway  Activity Response Tolerated well  Distance Ambulated (ft) 120 ft  $Mobility charge 1 Mobility  Level of Assistance Standby assist, set-up cues, supervision of patient - no hands on  Assistive Device Front wheel walker  Mobility Referral Yes   Pt received in bed and agreed to mobilize. No c/o pain some dizziness, went away EOS. Pt back to chair with all needs met and family in room.   Roderick Pee Mobility Specialist

## 2022-04-01 NOTE — Progress Notes (Signed)
Report given to Mickel Baas at [facility] as well as carelink

## 2022-04-01 NOTE — TOC Progression Note (Signed)
Transition of Care [facility]) - Progression Note    Patient Details  Name: Veronica Wood MRN: [id number] Date of Birth: 07/16/1950  Transition of Care [facility]) CM/SW [place], LCSW Phone Number: 04/01/2022, 10:19 AM  Clinical Narrative:    Met with pt and spouse at bedside. Pt is now being recommended to transfer to [facility] in order to have LTM. SNF bed offers will need to be reviewed closer to pt being medically stable as long as SNF remains plan at DC.    Expected Discharge Plan: [place] Barriers to Discharge: Continued Medical Work up  Expected Discharge Plan and Services Expected Discharge Plan: [place] In-house Referral: NA Discharge Planning Services: CM Consult Post Acute Care Choice: [place] Living arrangements for the past 2 months: Single Family Home                 DME Arranged: N/A DME Agency: NA                   Social Determinants of Health (SDOH) Interventions    Readmission Risk Interventions    03/31/2022   12:43 PM  Readmission Risk Prevention Plan  Post Dischage Appt Complete  Medication Screening Complete  Transportation Screening Complete

## 2022-04-01 NOTE — Progress Notes (Signed)
LTM new leads used, no skin breakdown at hookup, push button tested, called [facility] monitoring.

## 2022-04-01 NOTE — Progress Notes (Signed)
Neurology Progress Note  Brief HPI:  71 year old female with past medical history tension, hyperlipidemia, depression, thyroid disease who was initially brought to the hospital on 9/27 for confusion, drowsiness and a fall from chair.  She was initially a code stroke at [facility] long hospital, CT was negative for acute changes.  She was also recently started on amantadine for a trial to suppress tremors. Routine EEG showed triphasic morphology, predominantly in the posterior quadrant-question whether an ictal interictal continuum and she was transferred to [facility] for long-term monitoring.  Since her admission she has been confused and oriented to name only as well as slow to respond to commands.  MRI on 9/28 was negative for an acute stroke and showed advanced atrophy.  She is currently being supplemented for B12, thiamine, and folate.  She does also have an acute kidney injury with creatinine at 1.07 GFR at 56 as well as some urinary retention, she does now have a foley catheter.   Subjective: Seen and examined No active complaints   Exam: Vitals:   04/01/22 1409 04/01/22 1500  BP: 115/69 120/75  Pulse: 81 82  Resp: 18 18  Temp: 98.1 F (36.7 C) 98.3 F (36.8 C)  SpO2: 100% 100%   Gen: In bed, NAD Resp: non-labored breathing, no acute distress Abd: soft, nt  Neuro: Mental Status: Able to state her age and her name. She tells me we are at [facility] and that it is 2024. She is able to tell me that it is October now.  Cranial Nerves:PERRL, EOM, visual fields full, facial sensation symmetric, no facial asymmetry, hearing intact, palate elevates symmetrical, shoulder shrug intact, tongue is midline.  Motor: Tone is normal. Bulk is normal. 5/5 strength was present in all four extremities. Tremor noted in bilateral upper extremities Sensory: Sensation is symmetric to light touch and temperature in the arms and legs. No extinction to DSS present.   Deep Tendon Reflexes: 2+ and  symmetric in the biceps and patellae.  Plantars: Toes are downgoing bilaterally.  Cerebellar: past pointing with FNF, tremor in bilateral upper extremities, HKS intact Gait: deferred  Pertinent Labs: CR 1.07 GFR 56 B1 99.8 B12 181 TSH 0.665   Imaging Reviewed: -EEG- triphasics more in the posterior quadrant which could be on ictal interictal continuum. MRI without contrast-moderate chronic small vessel ischemic disease and advanced cerebral atrophy   Assessment:  71 year old female with past medical history hypertension, hyperlipidemia, depression, thyroid disease who was initially brought to the hospital on 9/27 for confusion, drowsiness and a fall from chair.  No evidence of UTI or any evidence of stroke on imaging. B12 levels are low from a neurological standpoint and need to be supplemented. EEG showed triphasics that were more in the posterior quadrant which could be a finding seen on the ictal interictal continuum for which we recommended that she be transferred over to [facility] for LTM EEG monitoring. Exam looks reassuring at this time with mild encephalopathy.  Impression Encephalopathy which is multifactorial with etiology that would include toxic metabolic encephalopathy as well as vitamin B-12 deficiency  Recommendations: - LTM EEG -B12 deficiency from a neurological standpoint-level 181.  I would recommend aggressive repletion of B12 for goal level greater than 400. -Hold off AEDs for now.  Decision on AEDs after LTM review for a few hours at least.  We will revisit this tomorrow morning.  Patient seen and examined by NP/APP with MD. MD to update note as needed.   Elmer Picker,  DNP, FNP-BC [facility] Pager: [phone]   Attending Neurohospitalist Addendum Patient seen and examined with APP/Resident. Agree with the history and physical as documented above. Agree with the plan as documented, which I helped formulate. I have  independently reviewed the chart, obtained history, review of systems and examined the patient.I have personally reviewed pertinent head/neck/spine imaging (CT/MRI). Please feel free to call with any questions.  -- Amie Portland, MD Neurologist [facility] Pager: [phone]

## 2022-04-01 NOTE — Progress Notes (Signed)
Mobility Specialist - Progress Note   04/01/22 1401  Mobility  Activity Ambulated with assistance in hallway  Activity Response Tolerated well  Distance Ambulated (ft) 325 ft  $Mobility charge 1 Mobility  Level of Assistance Standby assist, set-up cues, supervision of patient - no hands on  Assistive Device Front wheel walker  Mobility Referral Yes   Pt received in chair and agreed to mobility, no c/o pain nor discomfort. Passed gas during ambulation, pt returned to chair with all needs met and family in room.   Roderick Pee Mobility Specialist

## 2022-04-02 DIAGNOSIS — G9341 Metabolic encephalopathy: Secondary | ICD-10-CM | POA: Diagnosis not present

## 2022-04-02 DIAGNOSIS — N179 Acute kidney failure, unspecified: Secondary | ICD-10-CM | POA: Diagnosis not present

## 2022-04-02 DIAGNOSIS — R4182 Altered mental status, unspecified: Secondary | ICD-10-CM | POA: Diagnosis not present

## 2022-04-02 DIAGNOSIS — R338 Other retention of urine: Secondary | ICD-10-CM | POA: Diagnosis not present

## 2022-04-02 LAB — CBC WITH DIFFERENTIAL/PLATELET
Abs Immature Granulocytes: 0.05 10*3/uL (ref 0.00–0.07)
Basophils Absolute: 0 10*3/uL (ref 0.0–0.1)
Basophils Relative: 0 %
Eosinophils Absolute: 0.1 10*3/uL (ref 0.0–0.5)
Eosinophils Relative: 2 %
HCT: 37.5 % (ref 36.0–46.0)
Hemoglobin: 12.7 g/dL (ref 12.0–15.0)
Immature Granulocytes: 1 %
Lymphocytes Relative: 25 %
Lymphs Abs: 2.2 10*3/uL (ref 0.7–4.0)
MCH: 33.1 pg (ref 26.0–34.0)
MCHC: 33.9 g/dL (ref 30.0–36.0)
MCV: 97.7 fL (ref 80.0–100.0)
Monocytes Absolute: 1.4 10*3/uL — ABNORMAL HIGH (ref 0.1–1.0)
Monocytes Relative: 15 %
Neutro Abs: 5.2 10*3/uL (ref 1.7–7.7)
Neutrophils Relative %: 57 %
Platelets: 373 10*3/uL (ref 150–400)
RBC: 3.84 MIL/uL — ABNORMAL LOW (ref 3.87–5.11)
RDW: 13.2 % (ref 11.5–15.5)
WBC: 9 10*3/uL (ref 4.0–10.5)
nRBC: 0 % (ref 0.0–0.2)

## 2022-04-02 LAB — BASIC METABOLIC PANEL
Anion gap: 10 (ref 5–15)
BUN: 9 mg/dL (ref 8–23)
CO2: 24 mmol/L (ref 22–32)
Calcium: 9.4 mg/dL (ref 8.9–10.3)
Chloride: 103 mmol/L (ref 98–111)
Creatinine, Ser: 0.9 mg/dL (ref 0.44–1.00)
GFR, Estimated: >60 mL/min (ref >60)
Glucose, Bld: 94 mg/dL (ref 70–99)
Potassium: 3.9 mmol/L (ref 3.5–5.1)
Sodium: 137 mmol/L (ref 135–145)

## 2022-04-02 LAB — MAGNESIUM: Magnesium: 1.9 mg/dL (ref 1.7–2.4)

## 2022-04-02 LAB — GLUCOSE, CAPILLARY: Glucose-Capillary: 80 mg/dL (ref 70–99)

## 2022-04-02 NOTE — Progress Notes (Signed)
PROGRESS NOTE    Veronica Wood  TKZ:[id number] DOB: [birth year] DOA: 03/26/2022 PCP: Merri Brunette, MD   Brief Narrative:  71 yo female with PMH anxiety, HTN, HLD, depression, thyroid disease who was brought into the hospital due to confusion, feeling drowsy, and falling out of chair at home.  On presentation, CT of the head was negative for acute changes.  She had recently been started on amantadine on 02/24/2022 for 30-day supply for trial of suppressing tremors (no significant improvement in tremors per husband).  UDS was negative.  UA was negative for signs of infection.  TSH was normal, ammonia negative.  Neurology was consulted.  MRI of brain was negative for acute stroke.  B12 and folate levels were low: Supplementation started.  EEG somewhat abnormal: Patient was transferred to [facility] for LTM EEG.  Assessment & Plan:   Acute metabolic encephalopathy -Questionable cause.  Normal TSH and ammonia.  Negative UDS and UA. -?  Medication side effect (possibly from amantadine versus recent trial of increased dose of Seroquel) versus dementia versus nutrient/vitamin deficiency -MRI of brain was negative for acute stroke -EEG somewhat abnormal.  Transferred to [facility] for LTM EEG per neurology.  Follow further neurology recommendations  Vitamin B12 and folate deficiency -Currently supplementation  Acute urinary retention -Foley catheter placed on 03/27/2022.  Will need a voiding trial prior to discharge if mental status improves  AKI -Treated with IV fluids.  Resolved.  Hypothyroidism Continue levothyroxine  Hypertension--continue endodontal, amlodipine, losartan  Hyperlipidemia -Statin on hold for now  Coronary atherosclerosis due to calcified coronary lesion -follows up with Dr. Odis Hollingshead as an outpatient.  Resume aspirin and statin on discharge.  Physical deconditioning -Will need SNF placement as per PT recommendations.  TOC following.  Depression -Currently on Lamictal,  lorazepam and quetiapine   DVT prophylaxis:  Code Status: Full Family Communication: None at bedside Disposition Plan: Status is: Inpatient Remains inpatient appropriate because: Of severity of illness.  Need for SNF placement.  Consultants: Neurology  Procedures: EEG.  LTM EEG ongoing  Antimicrobials: None   Subjective: Patient seen and examined at bedside.  No overnight seizures, fever, vomiting reported.  Objective: Vitals:   04/01/22 2020 04/02/22 0021 04/02/22 0357 04/02/22 0741  BP: 125/72 137/86 (!) 101/50 107/69  Pulse: 76 75 72 77  Resp: 16 16 12 19   Temp: 98.4 F (36.9 C) 98.1 F (36.7 C) 98 F (36.7 C) 98.2 F (36.8 C)  TempSrc: Oral Oral Oral Oral  SpO2: 97% 97% 98% 100%  Weight:      Height:        Intake/Output Summary (Last 24 hours) at 04/02/2022 1141 Last data filed at 04/02/2022 0946 Gross per 24 hour  Intake 480 ml  Output 1600 ml  Net -1120 ml   Filed Weights   03/26/22 1312  Weight: 72 kg    Examination:  General exam: Appears calm and comfortable.  Chronically ill and deconditioned.  Undergoing LTM EEG. Respiratory system: Bilateral decreased breath sounds at bases with some scattered crackles Cardiovascular system: S1 & S2 heard, Rate controlled Gastrointestinal system: Abdomen is nondistended, soft and nontender. Normal bowel sounds heard. Extremities: No cyanosis, clubbing, edema  Central nervous system: Alert, slow to respond, poor historian.  No focal neurological deficits. Moving extremities Skin: No rashes, lesions or ulcers Psychiatry: Flat affect.  No signs of agitation.   Data Reviewed: I have personally reviewed following labs and imaging studies  CBC: Recent Labs  Lab 03/27/22 0415 03/29/22 03/31/22  03/31/22 0503 04/01/22 0512 04/02/22 0609  WBC 6.3 10.1 8.4 6.5 9.0  NEUTROABS 4.3 7.9* 5.3 4.2 5.2  HGB 12.2 12.2 13.0 12.6 12.7  HCT 36.4 36.6 39.2 38.4 37.5  MCV 98.4 98.7 98.5 101.1* 97.7  PLT 246 286 394 330 355    Basic Metabolic Panel: Recent Labs  Lab 03/27/22 0415 03/29/22 0611 03/31/22 0503 04/01/22 0512 04/02/22 0609  NA 137 135 134* 134* 137  K 3.1* 3.3* 3.9 3.8 3.9  CL 103 104 102 104 103  CO2 27 24 26 22 24   GLUCOSE 104* 110* 107* 106* 94  BUN 15 12 10 14 9   CREATININE 1.05* 0.81 0.76 1.07* 0.90  CALCIUM 8.6* 8.7* 9.1 9.0 9.4  MG 1.8 1.5* 1.9 2.1 1.9   GFR: Estimated Creatinine Clearance: 59.1 mL/min (by C-G formula based on SCr of 0.9 mg/dL). Liver Function Tests: Recent Labs  Lab 03/26/22 1357 03/27/22 0415  AST 46* 68*  ALT 27 31  ALKPHOS 64 59  BILITOT 1.0 0.8  PROT 7.2 6.1*  ALBUMIN 4.2 3.7   No results for input(s): "LIPASE", "AMYLASE" in the last 168 hours. Recent Labs  Lab 03/26/22 1531 03/30/22 1011  AMMONIA 16 20   Coagulation Profile: Recent Labs  Lab 03/26/22 1357  INR 0.9   Cardiac Enzymes: No results for input(s): "CKTOTAL", "CKMB", "CKMBINDEX", "TROPONINI" in the last 168 hours. BNP (last 3 results) No results for input(s): "PROBNP" in the last 8760 hours. HbA1C: No results for input(s): "HGBA1C" in the last 72 hours. CBG: Recent Labs  Lab 03/26/22 1359 04/01/22 2017  GLUCAP 98 110*   Lipid Profile: No results for input(s): "CHOL", "HDL", "LDLCALC", "TRIG", "CHOLHDL", "LDLDIRECT" in the last 72 hours. Thyroid Function Tests: No results for input(s): "TSH", "T4TOTAL", "FREET4", "T3FREE", "THYROIDAB" in the last 72 hours. Anemia Panel: No results for input(s): "VITAMINB12", "FOLATE", "FERRITIN", "TIBC", "IRON", "RETICCTPCT" in the last 72 hours. Sepsis Labs: No results for input(s): "PROCALCITON", "LATICACIDVEN" in the last 168 hours.  Recent Results (from the past 240 hour(s))  Resp Panel by RT-PCR (Flu A&B, Covid) Urine, In & Out Cath     Status: None   Collection Time: 03/26/22  1:57 PM   Specimen: Urine, In & Out Cath; Nasal Swab  Result Value Ref Range Status   SARS Coronavirus 2 by RT PCR NEGATIVE NEGATIVE Final    Comment:  (NOTE) SARS-CoV-2 target nucleic acids are NOT DETECTED.  The SARS-CoV-2 RNA is generally detectable in upper respiratory specimens during the acute phase of infection. The lowest concentration of SARS-CoV-2 viral copies this assay can detect is 138 copies/mL. A negative result does not preclude SARS-Cov-2 infection and should not be used as the sole basis for treatment or other patient management decisions. A negative result may occur with  improper specimen collection/handling, submission of specimen other than nasopharyngeal swab, presence of viral mutation(s) within the areas targeted by this assay, and inadequate number of viral copies(<138 copies/mL). A negative result must be combined with clinical observations, patient history, and epidemiological information. The expected result is Negative.  Fact Sheet for Patients:  EntrepreneurPulse.com.au  Fact Sheet for Healthcare Providers:  IncredibleEmployment.be  This test is no t yet approved or cleared by the [place] FDA and  has been authorized for detection and/or diagnosis of SARS-CoV-2 by FDA under an Emergency Use Authorization (EUA). This EUA will remain  in effect (meaning this test can be used) for the duration of the COVID-19 declaration under Section 564(b)(1) of the  Act, 21 U.S.C.section 360bbb-3(b)(1), unless the authorization is terminated  or revoked sooner.       Influenza A by PCR NEGATIVE NEGATIVE Final   Influenza B by PCR NEGATIVE NEGATIVE Final    Comment: (NOTE) The Xpert Xpress SARS-CoV-2/FLU/RSV plus assay is intended as an aid in the diagnosis of influenza from Nasopharyngeal swab specimens and should not be used as a sole basis for treatment. Nasal washings and aspirates are unacceptable for Xpert Xpress SARS-CoV-2/FLU/RSV testing.  Fact Sheet for Patients: BloggerCourse.com  Fact Sheet for Healthcare  Providers: SeriousBroker.it  This test is not yet approved or cleared by the [place] FDA and has been authorized for detection and/or diagnosis of SARS-CoV-2 by FDA under an Emergency Use Authorization (EUA). This EUA will remain in effect (meaning this test can be used) for the duration of the COVID-19 declaration under Section 564(b)(1) of the Act, 21 U.S.C. section 360bbb-3(b)(1), unless the authorization is terminated or revoked.  Performed at [facility], [address], [place], [place] [address]          Radiology Studies: Overnight EEG with video  Result Date: 04/02/2022 Charlsie Quest, MD     04/02/2022  9:17 AM Patient Name: Veronica Wood MRN: [id number] Epilepsy Attending: Charlsie Quest Referring Physician/Provider: Milon Dikes, MD Duration: 04/01/2022 1524 to 04/02/2022 0915  Patient history: 71yo f with ams. EEG to evaluate for seizure  Level of alertness: Awake, asleep  AEDs during EEG study: LTG  Technical aspects: This EEG study was done with scalp electrodes positioned according to the 10-20 International system of electrode placement. Electrical activity was reviewed with band pass filter of 1-70Hz , sensitivity of 7 uV/mm, display speed of 19mm/sec with a 60Hz  notched filter applied as appropriate. EEG data were recorded continuously and digitally stored.  Video monitoring was available and reviewed as appropriate.  Description: The posterior dominant rhythm consists of 7.5 Hz activity of moderate voltage (25-35 uV) seen predominantly in posterior head regions, symmetric and reactive to eye opening and eye closing. Sleep was characterized by vertex waves, sleep spindles (12 to 14 Hz), maximal frontocentral region. EEG showed intermittent generalized 5 to 7 Hz theta slowing. Hyperventilation and photic stimulation were not performed.    ABNORMALITY - Intermittent slow, generalized  IMPRESSION: This study is suggestive of  mild diffuse encephalopathy, nonspecific etiology. No seizures or definite epileptiform discharges were seen throughout the recording. EEG appears to have improved compared to previous study.     EEG adult  Result Date: 03/31/2022 05/31/2022, MD     03/31/2022  5:23 PM Patient Name: Veronica Wood MRN: [name] Epilepsy Attending: [id number] Referring Physician/Provider: Charlsie Quest, MD Date: 03/31/2022 Duration: 23.35 mins Patient history: 71yo f with ams. EEG to evaluate for seizure Level of alertness: Awake AEDs during EEG study: Ativan, LTG Technical aspects: This EEG study was done with scalp electrodes positioned according to the 10-20 International system of electrode placement. Electrical activity was reviewed with band pass filter of 1-70Hz , sensitivity of 7 uV/mm, display speed of 49mm/sec with a 60Hz  notched filter applied as appropriate. EEG data were recorded continuously and digitally stored.  Video monitoring was available and reviewed as appropriate. Description: No posterior dominant rhythm was seen. EEG showed continuous generalized 3 to 6 Hz theta-delta slowing. Generalized and maximal posterior quadrant periodic discharges with triphasic morphology at 2-2.5Hz  were noted. Hyperventilation and photic stimulation were not performed.   ABNORMALITY - Periodic discharges with  triphasic morphology, generalized and maximal posterior quadrant( GPDs) - Continuous slow, generalized IMPRESSION: This study showed generalized and maximal posterior quadrant periodic discharges with triphasic morphology at  2-2.5Hz . This eeg pattern is on the ictal-interictal continuum but can also be seen due to toxic-metabolic causes. Please consider long term monitoring if clinically indicated. Additionally there is moderate diffuse encephalopathy, nonspecific etiology. No seizures were seen throughout the recording. Dr. Wilford Corner was notified. Priyanka Annabelle Harman        Scheduled Meds:   amLODipine  5 mg Oral Daily   atenolol  25 mg Oral BID   Chlorhexidine Gluconate Cloth  6 each Topical Daily   cyanocobalamin  1,000 mcg Subcutaneous Daily   DULoxetine  60 mg Oral Daily   enoxaparin (LOVENOX) injection  40 mg Subcutaneous Q24H   folic acid  1 mg Oral Daily   lamoTRIgine  150 mg Oral Daily   levothyroxine  50 mcg Oral Q0600   loratadine  10 mg Oral Daily   LORazepam  0.5 mg Oral BID   losartan  25 mg Oral Daily   multivitamin with minerals  1 tablet Oral Daily   QUEtiapine  800 mg Oral QHS   sodium chloride flush  3 mL Intravenous Q12H   thiamine  100 mg Oral Daily   Continuous Infusions:        Glade Lloyd, MD [facility] 04/02/2022, 11:41 AM

## 2022-04-02 NOTE — Progress Notes (Signed)
Neurology Progress Note  Brief HPI:  71 year old female with past medical history tension, hyperlipidemia, depression, thyroid disease who was initially brought to the hospital on 9/27 for confusion, drowsiness and a fall from chair.  She was initially a code stroke at [facility] long hospital, CT was negative for acute changes.  She was also recently started on amantadine for a trial to suppress tremors. Routine EEG showed triphasic morphology, predominantly in the posterior quadrant-question whether an ictal interictal continuum and she was transferred to [facility] for long-term monitoring.  Since her admission she has been confused and oriented to name only as well as slow to respond to commands.  MRI on 9/28 was negative for an acute stroke and showed advanced atrophy.  She is currently being supplemented for B12, thiamine, and folate.  She does also have an acute kidney injury with creatinine at 1.07 GFR at 56 as well as some urinary retention, she does now have a foley catheter.   Subjective: Has been hemodynamically stable and afebrile overnight No active complaints Cr down to 0.9   Exam: Vitals:   04/02/22 0357 04/02/22 0741  BP: (!) 101/50 107/69  Pulse: 72 77  Resp: 12 19  Temp: 98 F (36.7 C) 98.2 F (36.8 C)  SpO2: 98% 100%   Gen: In bed, NAD Resp: non-labored breathing, no acute distress Abd: soft, nt  Neuro: Mental Status: Alert and oriented x3, able to converse about her condition   Cranial Nerves:PERRL, EOM, visual fields full, facial sensation symmetric, no facial asymmetry, hearing intact, phonation normal, shoulder shrug intact, tongue is midline.  Motor: Tone is normal. Bulk is normal. 5/5 strength was present in BUE with 4+/5 strength in BLE.  Sensory: Sensation is symmetric to light touch and in the arms and legs.    Deep Tendon Reflexes: 2+ and symmetric in the biceps and patellae.  Plantars: Toes are downgoing bilaterally.  Cerebellar: FNF intact  bilaterally, HKS intact Gait: deferred  Pertinent Labs: CR 10.0-> 0.9 GFR 56-> 59.1 B1 99.8 B12 181 TSH 0.665   Imaging Reviewed: -EEG- triphasics more in the posterior quadrant which could be on ictal interictal continuum. MRI without contrast-moderate chronic small vessel ischemic disease and advanced cerebral atrophy  EEG overnight continues to show mild diffuse encephalopathy but is better than yesterday.  Low concern for seizures.   Assessment:  71 year old female with past medical history hypertension, hyperlipidemia, depression, thyroid disease who was initially brought to the hospital on 9/27 for confusion, drowsiness and a fall from chair.  No evidence of UTI or any evidence of stroke on imaging. B12 levels are low from a neurological standpoint and need to be supplemented. EEG showed triphasics that were more in the posterior quadrant which could be a finding seen on the ictal interictal continuum for which we recommended that she be transferred over to [facility] for LTM EEG monitoring. Exam looks reassuring at this time with mild encephalopathy, appears somewhat improved today.   LTM EEG shows improved tone with low suspicion for this being secondary to seizures.  Impression Encephalopathy which is multifactorial with etiology that would include toxic metabolic encephalopathy as well as vitamin B-12 deficiency  Recommendations: - discontinue LTM EEG -B12 deficiency from a neurological standpoint-level 181.  I would recommend aggressive repletion of B12 for goal level greater than 400. -Hold off AEDs, as no seizure activity seen -Continue correction of toxic metabolic derangements per primary team as you are No further inpatient neurological recommendations other than above  at this time. Please call neurology as needed.  Patient seen and examined by NP/APP with MD. MD to update note as needed.   [place] , MSN, AGACNP-BC [facility] See Amion for schedule and pager information 04/02/2022 9:01 AM   Attending Neurohospitalist Addendum Patient seen and examined with APP/Resident. Agree with the history and physical as documented above. Agree with the plan as documented, which I helped formulate. I have independently reviewed the chart, obtained history, review of systems and examined the patient.I have personally reviewed pertinent head/neck/spine imaging (CT/MRI). Please feel free to call with any questions.  -- Amie Portland, MD Neurologist [facility] Pager: [phone]

## 2022-04-02 NOTE — TOC Progression Note (Signed)
Transition of Care [facility]) - Progression Note    Patient Details  Name: Veronica Wood MRN: [id number] Date of Birth: [birth year]  Transition of Care [facility]) CM/SW [place], [place] Phone Number: 04/02/2022, 2:55 PM  Clinical Narrative:   CSW met with patient to provide and discuss bed offers. Patient asked for time to review with family, CSW to follow back up with patient on SNF choice. CSW to follow.    Expected Discharge Plan: [place] Barriers to Discharge: Continued Medical Work up  Expected Discharge Plan and Services Expected Discharge Plan: [place] In-house Referral: NA Discharge Planning Services: CM Consult Post Acute Care Choice: [place] Living arrangements for the past 2 months: Single Family Home                 DME Arranged: N/A DME Agency: NA                   Social Determinants of Health (SDOH) Interventions    Readmission Risk Interventions    04/01/2022   10:22 AM 03/31/2022   12:43 PM  Readmission Risk Prevention Plan  Post Dischage Appt Complete Complete  Medication Screening Complete Complete  Transportation Screening Complete Complete

## 2022-04-02 NOTE — Progress Notes (Signed)
LTM EEG discontinued - no skin breakdown at unhook.   

## 2022-04-02 NOTE — Progress Notes (Signed)
Physical Therapy Treatment Patient Details Name: Veronica Wood MRN: [id number] DOB: [birth year] Today's Date: 04/02/2022   History of Present Illness 71 yo female with PMH anxiety, HTN, HLD, depression, thyroid disease who was brought into the hospital due to confusion, feeling drowsy, and falling out of chair at home and admitted 03/26/22 for Acute metabolic encephalopathy.  "MRI brain 9/28 negative for stroke and shows advanced atrophy which I've explained can contribute to demential-like behavior" per MD note    PT Comments    Pt progressing well with function however demo'd impaired cognition in form of following directions, executive functioning, STM loss, not oriented to date, delayed processing and sequencing. Pt also with noted impaired balance and requiring max verbal cues to initiate task. Pt will need 24/7 assist for safe transition home with spouse. Unsure if this is patients cognitive baseline or if this is a decline or if spouse can provide 24/7 assist. At this time continue to recommend SNF Upon d/c. Acute PT to cont to follow.   Recommendations for follow up therapy are one component of a multi-disciplinary discharge planning process, led by the attending physician.  Recommendations may be updated based on patient status, additional functional criteria and insurance authorization.  Follow Up Recommendations  Skilled nursing-short term rehab (<3 hours/day) Can patient physically be transported by private vehicle: Yes   Assistance Recommended at Discharge Frequent or constant Supervision/Assistance  Patient can return home with the following A little help with walking and/or transfers;A little help with bathing/dressing/bathroom;Help with stairs or ramp for entrance;Assistance with cooking/housework;Assist for transportation   Equipment Recommendations  None recommended by PT    Recommendations for Other Services       Precautions / Restrictions Precautions Precautions:  Fall Restrictions Weight Bearing Restrictions: No     Mobility  Bed Mobility Overal bed mobility: Needs Assistance Bed Mobility: Supine to Sit     Supine to sit: Min assist     General bed mobility comments: increased time, tactile cues to initiate, minA for trunk elevation    Transfers Overall transfer level: Needs assistance Equipment used: Rolling walker (2 wheels) Transfers: Sit to/from Stand Sit to Stand: Min assist           General transfer comment: light assist to steady with rise, cues for hand placement    Ambulation/Gait Ambulation/Gait assistance: Min assist Gait Distance (Feet): 160 Feet Assistive device: Rolling walker (2 wheels) Gait Pattern/deviations: Step-through pattern, Decreased stride length Gait velocity: dec     General Gait Details: verbal cues for use of UEs through RW for steadying, assist for stabilizing with avoiding obstacles, improved stability with straight path today, vears to the L   Stairs Stairs: Yes Stairs assistance: Min assist Stair Management: One rail Right Number of Stairs: 2 (x2) General stair comments: max directional verbal cues to initiate   Wheelchair Mobility    Modified Rankin (Stroke Patients Only)       Balance Overall balance assessment: History of Falls, Needs assistance Sitting-balance support: Feet supported, No upper extremity supported Sitting balance-Leahy Scale: Fair     Standing balance support: Bilateral upper extremity supported, Reliant on assistive device for balance Standing balance-Leahy Scale: Poor Standing balance comment: requires RW                            Cognition Arousal/Alertness: Awake/alert Behavior During Therapy: WFL for tasks assessed/performed Overall Cognitive Status: History of cognitive impairments - at baseline  General Comments: possible dementia, MRI: advanced atrophy. pt unable to follow directional  commands involving L/R, unable to read the signs in hallways to figure how to get to her room 3w28, noted short term memory deficits, unable to state date even after being reoriented by RN and PT t/o the day        Exercises      General Comments General comments (skin integrity, edema, etc.): VSS      Pertinent Vitals/Pain Pain Assessment Pain Assessment: No/denies pain    Home Living                          Prior Function            PT Goals (current goals can now be found in the care plan section) Acute Rehab PT Goals PT Goal Formulation: With patient Time For Goal Achievement: 04/11/22 Potential to Achieve Goals: Good Progress towards PT goals: Progressing toward goals    Frequency    Min 2X/week      PT Plan Current plan remains appropriate    Co-evaluation              AM-PAC PT "6 Clicks" Mobility   Outcome Measure  Help needed turning from your back to your side while in a flat bed without using bedrails?: A Little Help needed moving from lying on your back to sitting on the side of a flat bed without using bedrails?: A Little Help needed moving to and from a bed to a chair (including a wheelchair)?: A Little Help needed standing up from a chair using your arms (e.g., wheelchair or bedside chair)?: A Little Help needed to walk in hospital room?: A Little Help needed climbing 3-5 steps with a railing? : A Lot 6 Click Score: 17    End of Session Equipment Utilized During Treatment: Gait belt Activity Tolerance: Patient tolerated treatment well Patient left: in chair;with call bell/phone within reach;with nursing/sitter in room;with chair alarm set Psychiatrist present) Nurse Communication: Mobility status PT Visit Diagnosis: Difficulty in walking, not elsewhere classified (R26.2);Unsteadiness on feet (R26.81)     Time: 6734-1937 PT Time Calculation (min) (ACUTE ONLY): 28 min  Charges:  $Gait Training: 23-37 mins                      Lewis Shock, PT, DPT Acute Rehabilitation Services Secure chat preferred Office #: [phone]    Veronica Wood 04/02/2022, 2:32 PM

## 2022-04-02 NOTE — Progress Notes (Signed)
Mobility Specialist: Progress Note   04/02/22 1704  Mobility  Activity Ambulated with assistance in hallway  Activity Response Tolerated well  Distance Ambulated (ft) 240 ft  $Mobility charge 1 Mobility  Level of Assistance Minimal assist, patient does 75% or more  Assistive Device Front wheel walker   Pt received in the chair and agreeable to mobility. No c/o throughout. Verbal cues and physical assist for RW management as pt had difficulty navigating obstacles on L side. Pt back to the chair after session with call bell at her side.   [facility] Tameem Pullara Mobility Specialist Mobility Specialist [address]: [phone]

## 2022-04-02 NOTE — Procedures (Addendum)
Patient Name: Veronica Wood  MRN: [id number]  Epilepsy Attending: Lora Havens  Referring Physician/Provider: Amie Portland, MD Duration: 04/01/2022 1524 to 04/02/2022 1120   Patient history: 71yo f with ams. EEG to evaluate for seizure   Level of alertness: Awake, asleep   AEDs during EEG study: LTG   Technical aspects: This EEG study was done with scalp electrodes positioned according to the 10-20 International system of electrode placement. Electrical activity was reviewed with band pass filter of 1-70Hz , sensitivity of 7 uV/mm, display speed of 91mm/sec with a 60Hz  notched filter applied as appropriate. EEG data were recorded continuously and digitally stored.  Video monitoring was available and reviewed as appropriate.   Description: The posterior dominant rhythm consists of 7.5 Hz activity of moderate voltage (25-35 uV) seen predominantly in posterior head regions, symmetric and reactive to eye opening and eye closing. Sleep was characterized by vertex waves, sleep spindles (12 to 14 Hz), maximal frontocentral region. EEG showed intermittent generalized 5 to 7 Hz theta slowing. Hyperventilation and photic stimulation were not performed.      ABNORMALITY - Intermittent slow, generalized   IMPRESSION: This study is suggestive of mild diffuse encephalopathy, nonspecific etiology. No seizures or definite epileptiform discharges were seen throughout the recording.  EEG appears to have improved compared to previous study.   [name] Barbra Sarks

## 2022-04-02 NOTE — Progress Notes (Addendum)
Patient comfort afforded through the shift, mild tremors noted to the upper extremities, patient states are her baseline.

## 2022-04-02 NOTE — Progress Notes (Signed)
LTM maint complete - no skin breakdown under: f3,f4

## 2022-04-03 DIAGNOSIS — Z7401 Bed confinement status: Secondary | ICD-10-CM | POA: Diagnosis not present

## 2022-04-03 DIAGNOSIS — F29 Unspecified psychosis not due to a substance or known physiological condition: Secondary | ICD-10-CM | POA: Diagnosis not present

## 2022-04-03 DIAGNOSIS — G4700 Insomnia, unspecified: Secondary | ICD-10-CM | POA: Diagnosis not present

## 2022-04-03 DIAGNOSIS — E039 Hypothyroidism, unspecified: Secondary | ICD-10-CM | POA: Diagnosis not present

## 2022-04-03 DIAGNOSIS — E782 Mixed hyperlipidemia: Secondary | ICD-10-CM | POA: Diagnosis not present

## 2022-04-03 DIAGNOSIS — F411 Generalized anxiety disorder: Secondary | ICD-10-CM | POA: Diagnosis not present

## 2022-04-03 DIAGNOSIS — K5900 Constipation, unspecified: Secondary | ICD-10-CM | POA: Diagnosis not present

## 2022-04-03 DIAGNOSIS — R251 Tremor, unspecified: Secondary | ICD-10-CM | POA: Diagnosis not present

## 2022-04-03 DIAGNOSIS — I10 Essential (primary) hypertension: Secondary | ICD-10-CM | POA: Diagnosis not present

## 2022-04-03 DIAGNOSIS — F0392 Unspecified dementia, unspecified severity, with psychotic disturbance: Secondary | ICD-10-CM | POA: Diagnosis not present

## 2022-04-03 DIAGNOSIS — G9340 Encephalopathy, unspecified: Secondary | ICD-10-CM | POA: Diagnosis not present

## 2022-04-03 DIAGNOSIS — R338 Other retention of urine: Secondary | ICD-10-CM | POA: Diagnosis not present

## 2022-04-03 DIAGNOSIS — F32A Depression, unspecified: Secondary | ICD-10-CM | POA: Diagnosis not present

## 2022-04-03 DIAGNOSIS — F331 Major depressive disorder, recurrent, moderate: Secondary | ICD-10-CM | POA: Diagnosis not present

## 2022-04-03 DIAGNOSIS — R41841 Cognitive communication deficit: Secondary | ICD-10-CM | POA: Diagnosis not present

## 2022-04-03 DIAGNOSIS — R2689 Other abnormalities of gait and mobility: Secondary | ICD-10-CM | POA: Diagnosis not present

## 2022-04-03 DIAGNOSIS — R339 Retention of urine, unspecified: Secondary | ICD-10-CM | POA: Diagnosis not present

## 2022-04-03 DIAGNOSIS — F03A0 Unspecified dementia, mild, without behavioral disturbance, psychotic disturbance, mood disturbance, and anxiety: Secondary | ICD-10-CM | POA: Diagnosis not present

## 2022-04-03 DIAGNOSIS — R531 Weakness: Secondary | ICD-10-CM | POA: Diagnosis not present

## 2022-04-03 DIAGNOSIS — R2681 Unsteadiness on feet: Secondary | ICD-10-CM | POA: Diagnosis not present

## 2022-04-03 DIAGNOSIS — I2510 Atherosclerotic heart disease of native coronary artery without angina pectoris: Secondary | ICD-10-CM | POA: Diagnosis not present

## 2022-04-03 DIAGNOSIS — M810 Age-related osteoporosis without current pathological fracture: Secondary | ICD-10-CM | POA: Diagnosis not present

## 2022-04-03 DIAGNOSIS — F0390 Unspecified dementia without behavioral disturbance: Secondary | ICD-10-CM | POA: Diagnosis not present

## 2022-04-03 DIAGNOSIS — M6281 Muscle weakness (generalized): Secondary | ICD-10-CM | POA: Diagnosis not present

## 2022-04-03 DIAGNOSIS — G9341 Metabolic encephalopathy: Secondary | ICD-10-CM | POA: Diagnosis not present

## 2022-04-03 DIAGNOSIS — N390 Urinary tract infection, site not specified: Secondary | ICD-10-CM | POA: Diagnosis not present

## 2022-04-03 DIAGNOSIS — F419 Anxiety disorder, unspecified: Secondary | ICD-10-CM | POA: Diagnosis not present

## 2022-04-03 LAB — CBC WITH DIFFERENTIAL/PLATELET
Abs Immature Granulocytes: 0.04 10*3/uL (ref 0.00–0.07)
Basophils Absolute: 0 10*3/uL (ref 0.0–0.1)
Basophils Relative: 1 %
Eosinophils Absolute: 0.1 10*3/uL (ref 0.0–0.5)
Eosinophils Relative: 2 %
HCT: 38.1 % (ref 36.0–46.0)
Hemoglobin: 12.8 g/dL (ref 12.0–15.0)
Immature Granulocytes: 1 %
Lymphocytes Relative: 22 %
Lymphs Abs: 1.6 10*3/uL (ref 0.7–4.0)
MCH: 33.1 pg (ref 26.0–34.0)
MCHC: 33.6 g/dL (ref 30.0–36.0)
MCV: 98.4 fL (ref 80.0–100.0)
Monocytes Absolute: 0.8 10*3/uL (ref 0.1–1.0)
Monocytes Relative: 10 %
Neutro Abs: 4.8 10*3/uL (ref 1.7–7.7)
Neutrophils Relative %: 64 %
Platelets: 357 10*3/uL (ref 150–400)
RBC: 3.87 MIL/uL (ref 3.87–5.11)
RDW: 13.4 % (ref 11.5–15.5)
WBC: 7.4 10*3/uL (ref 4.0–10.5)
nRBC: 0 % (ref 0.0–0.2)

## 2022-04-03 LAB — BASIC METABOLIC PANEL
Anion gap: 6 (ref 5–15)
BUN: 13 mg/dL (ref 8–23)
CO2: 26 mmol/L (ref 22–32)
Calcium: 8.7 mg/dL — ABNORMAL LOW (ref 8.9–10.3)
Chloride: 102 mmol/L (ref 98–111)
Creatinine, Ser: 0.94 mg/dL (ref 0.44–1.00)
GFR, Estimated: >60 mL/min (ref >60)
Glucose, Bld: 98 mg/dL (ref 70–99)
Potassium: 4.2 mmol/L (ref 3.5–5.1)
Sodium: 134 mmol/L — ABNORMAL LOW (ref 135–145)

## 2022-04-03 LAB — MAGNESIUM: Magnesium: 2 mg/dL (ref 1.7–2.4)

## 2022-04-03 MED ORDER — LORAZEPAM 0.5 MG PO TABS: 0.5000 mg | ORAL_TABLET | Freq: Two times a day (BID) | ORAL | 0 refills | Status: AC | PRN

## 2022-04-03 NOTE — Progress Notes (Signed)
Pt discharged to [facility].  Called the facility x3 and was hung up on.  Called back and went to voice mail.  Called again and told the secretary what happen and again went to voicemail.  Called back and left my name and number and told them to call me for report

## 2022-04-03 NOTE — Progress Notes (Signed)
PROGRESS NOTE    CHIOMA MUKHERJEE  KZS:[id number] DOB: 01/05/1951 DOA: 03/26/2022 PCP: Deland Pretty, MD   Brief Narrative:  71 yo female with PMH anxiety, HTN, HLD, depression, thyroid disease who was brought into the hospital due to confusion, feeling drowsy, and falling out of chair at home.  On presentation, CT of the head was negative for acute changes.  She had recently been started on amantadine on 02/24/2022 for 30-day supply for trial of suppressing tremors (no significant improvement in tremors per husband).  UDS was negative.  UA was negative for signs of infection.  TSH was normal, ammonia negative.  Neurology was consulted.  MRI of brain was negative for acute stroke.  B12 and folate levels were low: Supplementation started.  EEG somewhat abnormal: Patient was transferred to [facility] for LTM EEG.  Assessment & Plan:   Acute metabolic encephalopathy -Questionable cause.  Normal TSH and ammonia.  Negative UDS and UA. -?  Medication side effect (possibly from amantadine versus recent trial of increased dose of Seroquel) versus dementia versus nutrient/vitamin deficiency -MRI of brain was negative for acute stroke -EEG somewhat abnormal.   -Subsequently, LTM EEG was normal.  No need for any AEDs as per neurology.  Neurology has signed off. -Outpatient follow-up with neurology.  Vitamin B12 and folate deficiency -Continue supplementation  Acute urinary retention -Foley catheter placed on 03/27/2022.  Mental status has improved: We will try voiding trial today.  AKI -Treated with IV fluids.  Resolved.  Hypothyroidism -Continue levothyroxine  Hypertension--continue endodontal, amlodipine, losartan  Hyperlipidemia -Statin on hold for now  Coronary atherosclerosis due to calcified coronary lesion -follows up with Dr. Terri Skains as an outpatient.  Resume aspirin and statin on discharge.  Physical deconditioning -Will need SNF placement as per PT recommendations.  TOC  following.  Depression -Currently on Lamictal, lorazepam and quetiapine   DVT prophylaxis:  Code Status: Full Family Communication: None at bedside Disposition Plan: Status is: Inpatient Remains inpatient appropriate because: Of severity of illness.  Need for SNF placement.  Currently medically stable for discharge to SNF.  Consultants: Neurology  Procedures: EEG.  LTM EEG ongoing  Antimicrobials: None   Subjective: Patient seen and examined at bedside.  Poor historian.  No seizures, vomiting, agitation reported. Objective: Vitals:   04/02/22 1635 04/02/22 1939 04/02/22 2321 04/03/22 0352  BP: 120/75 113/72 126/63 119/63  Pulse:  74 66 69  Resp:  16 16 16   Temp: 97.6 F (36.4 C) (!) 97.5 F (36.4 C) 97.9 F (36.6 C) 97.8 F (36.6 C)  TempSrc:  Oral Oral Oral  SpO2: 100% 98% 98% 98%  Weight:      Height:        Intake/Output Summary (Last 24 hours) at 04/03/2022 7322 Last data filed at 04/02/2022 2106 Gross per 24 hour  Intake 243 ml  Output 1000 ml  Net -757 ml    Filed Weights   03/26/22 1312  Weight: 72 kg    Examination:  General: On room air.  No distress.  Currently on room air.  Slow to respond.  Poor historian. respiratory: Decreased breath sounds at bases bilaterally with some crackles CVS: Currently rate controlled; S1-S2 heard  abdominal: Soft, nontender, slightly distended, no organomegaly; normal bowel sounds are heard  extremities: Trace lower extremity edema; no clubbing.      Data Reviewed: I have personally reviewed following labs and imaging studies  CBC: Recent Labs  Lab 03/29/22 0254 03/31/22 0503 04/01/22 2706 04/02/22 2376 04/03/22 0445  WBC 10.1 8.4 6.5 9.0 7.4  NEUTROABS 7.9* 5.3 4.2 5.2 4.8  HGB 12.2 13.0 12.6 12.7 12.8  HCT 36.6 39.2 38.4 37.5 38.1  MCV 98.7 98.5 101.1* 97.7 98.4  PLT 286 394 330 373 357    Basic Metabolic Panel: Recent Labs  Lab 03/29/22 0611 03/31/22 0503 04/01/22 0512 04/02/22 0609  04/03/22 0445  NA 135 134* 134* 137 134*  K 3.3* 3.9 3.8 3.9 4.2  CL 104 102 104 103 102  CO2 24 26 22 24 26   GLUCOSE 110* 107* 106* 94 98  BUN 12 10 14 9 13   CREATININE 0.81 0.76 1.07* 0.90 0.94  CALCIUM 8.7* 9.1 9.0 9.4 8.7*  MG 1.5* 1.9 2.1 1.9 2.0    GFR: Estimated Creatinine Clearance: 56.6 mL/min (by C-G formula based on SCr of 0.94 mg/dL). Liver Function Tests: No results for input(s): "AST", "ALT", "ALKPHOS", "BILITOT", "PROT", "ALBUMIN" in the last 168 hours.  No results for input(s): "LIPASE", "AMYLASE" in the last 168 hours. Recent Labs  Lab 03/30/22 1011  AMMONIA 20    Coagulation Profile: No results for input(s): "INR", "PROTIME" in the last 168 hours.  Cardiac Enzymes: No results for input(s): "CKTOTAL", "CKMB", "CKMBINDEX", "TROPONINI" in the last 168 hours. BNP (last 3 results) No results for input(s): "PROBNP" in the last 8760 hours. HbA1C: No results for input(s): "HGBA1C" in the last 72 hours. CBG: Recent Labs  Lab 04/01/22 2017 04/02/22 1607  GLUCAP 110* 80    Lipid Profile: No results for input(s): "CHOL", "HDL", "LDLCALC", "TRIG", "CHOLHDL", "LDLDIRECT" in the last 72 hours. Thyroid Function Tests: No results for input(s): "TSH", "T4TOTAL", "FREET4", "T3FREE", "THYROIDAB" in the last 72 hours. Anemia Panel: No results for input(s): "VITAMINB12", "FOLATE", "FERRITIN", "TIBC", "IRON", "RETICCTPCT" in the last 72 hours. Sepsis Labs: No results for input(s): "PROCALCITON", "LATICACIDVEN" in the last 168 hours.  Recent Results (from the past 240 hour(s))  Resp Panel by RT-PCR (Flu A&B, Covid) Urine, In & Out Cath     Status: None   Collection Time: 03/26/22  1:57 PM   Specimen: Urine, In & Out Cath; Nasal Swab  Result Value Ref Range Status   SARS Coronavirus 2 by RT PCR NEGATIVE NEGATIVE Final    Comment: (NOTE) SARS-CoV-2 target nucleic acids are NOT DETECTED.  The SARS-CoV-2 RNA is generally detectable in upper respiratory specimens  during the acute phase of infection. The lowest concentration of SARS-CoV-2 viral copies this assay can detect is 138 copies/mL. A negative result does not preclude SARS-Cov-2 infection and should not be used as the sole basis for treatment or other patient management decisions. A negative result may occur with  improper specimen collection/handling, submission of specimen other than nasopharyngeal swab, presence of viral mutation(s) within the areas targeted by this assay, and inadequate number of viral copies(<138 copies/mL). A negative result must be combined with clinical observations, patient history, and epidemiological information. The expected result is Negative.  Fact Sheet for Patients:  06/02/22  Fact Sheet for Healthcare Providers:  03/28/22  This test is no t yet approved or cleared by the [employer] FDA and  has been authorized for detection and/or diagnosis of SARS-CoV-2 by FDA under an Emergency Use Authorization (EUA). This EUA will remain  in effect (meaning this test can be used) for the duration of the COVID-19 declaration under Section 564(b)(1) of the Act, 21 U.S.C.section 360bbb-3(b)(1), unless the authorization is terminated  or revoked sooner.       Influenza A by PCR  NEGATIVE NEGATIVE Final   Influenza B by PCR NEGATIVE NEGATIVE Final    Comment: (NOTE) The Xpert Xpress SARS-CoV-2/FLU/RSV plus assay is intended as an aid in the diagnosis of influenza from Nasopharyngeal swab specimens and should not be used as a sole basis for treatment. Nasal washings and aspirates are unacceptable for Xpert Xpress SARS-CoV-2/FLU/RSV testing.  Fact Sheet for Patients: [employer]  Fact Sheet for Healthcare Providers: [employer]it  This test is not yet approved or cleared by the [place] FDA and has been authorized for detection  and/or diagnosis of SARS-CoV-2 by FDA under an Emergency Use Authorization (EUA). This EUA will remain in effect (meaning this test can be used) for the duration of the COVID-19 declaration under Section 564(b)(1) of the Act, 21 U.S.C. section 360bbb-3(b)(1), unless the authorization is terminated or revoked.  Performed at [facility], [address] [place], [place] [address]          Radiology Studies: Overnight EEG with video  Result Date: 04/02/2022 Charlsie Quest, MD     04/02/2022 12:10 PM Patient Name: SANNA PORCARO MRN: [id number] Epilepsy Attending: Charlsie Quest Referring Physician/Provider: Milon Dikes, MD Duration: 04/01/2022 1524 to 04/02/2022 1120  Patient history: 71yo f with ams. EEG to evaluate for seizure  Level of alertness: Awake, asleep  AEDs during EEG study: LTG  Technical aspects: This EEG study was done with scalp electrodes positioned according to the 10-20 International system of electrode placement. Electrical activity was reviewed with band pass filter of 1-70Hz , sensitivity of 7 uV/mm, display speed of 35mm/sec with a 60Hz  notched filter applied as appropriate. EEG data were recorded continuously and digitally stored.  Video monitoring was available and reviewed as appropriate.  Description: The posterior dominant rhythm consists of 7.5 Hz activity of moderate voltage (25-35 uV) seen predominantly in posterior head regions, symmetric and reactive to eye opening and eye closing. Sleep was characterized by vertex waves, sleep spindles (12 to 14 Hz), maximal frontocentral region. EEG showed intermittent generalized 5 to 7 Hz theta slowing. Hyperventilation and photic stimulation were not performed.    ABNORMALITY - Intermittent slow, generalized  IMPRESSION: This study is suggestive of mild diffuse encephalopathy, nonspecific etiology. No seizures or definite epileptiform discharges were seen throughout the recording. EEG appears to have  improved compared to previous study.  Priyanka         Scheduled Meds:  amLODipine  5 mg Oral Daily   atenolol  25 mg Oral BID   Chlorhexidine Gluconate Cloth  6 each Topical Daily   cyanocobalamin  1,000 mcg Subcutaneous Daily   DULoxetine  60 mg Oral Daily   enoxaparin (LOVENOX) injection  40 mg Subcutaneous Q24H   folic acid  1 mg Oral Daily   lamoTRIgine  150 mg Oral Daily   levothyroxine  50 mcg Oral Q0600   loratadine  10 mg Oral Daily   LORazepam  0.5 mg Oral BID   losartan  25 mg Oral Daily   multivitamin with minerals  1 tablet Oral Daily   QUEtiapine  800 mg Oral QHS   sodium chloride flush  3 mL Intravenous Q12H   thiamine  100 mg Oral Daily   Continuous Infusions:        Annabelle Harman, MD [facility] 04/03/2022, 8:22 AM

## 2022-04-03 NOTE — Discharge Summary (Signed)
Physician Discharge Summary  Veronica Wood GLO:[id number] DOB: [birth year] DOA: 03/26/2022  PCP: Merri Brunette, MD  Admit date: 03/26/2022 Discharge date: 04/03/2022  Admitted From: Home Disposition: SNF  Recommendations for Outpatient Follow-up:  Follow up with SNF provider at earliest convenience Outpatient follow-up with neurology and psychiatry Follow up in ED if symptoms worsen or new appear   Home Health: No Equipment/Devices: None  Discharge Condition: Stable CODE STATUS: Full Diet recommendation: Heart healthy  Brief/Interim Summary: 71 yo female with PMH anxiety, HTN, HLD, depression, thyroid disease who was brought into the hospital due to confusion, feeling drowsy, and falling out of chair at home.  On presentation, CT of the head was negative for acute changes.  She had recently been started on amantadine on 02/24/2022 for 30-day supply for trial of suppressing tremors (no significant improvement in tremors per husband).  UDS was negative.  UA was negative for signs of infection.  TSH was normal, ammonia negative.  Neurology was consulted.  MRI of brain was negative for acute stroke.  B12 and folate levels were low: Supplementation started.  EEG somewhat abnormal: Patient was transferred to [facility] for LTM EEG.  LTM EEG was negative for seizures.  Neurology signed off and recommended no AEDs and recommended to treat vitamin deficiency.  PT recommended SNF placement.  She will be discharged to SNF once bed is available.    Discharge Diagnoses:   Acute metabolic encephalopathy -Questionable cause.  Normal TSH and ammonia.  Negative UDS and UA. -?  Medication side effect (possibly from amantadine versus recent trial of increased dose of Seroquel) versus dementia versus nutrient/vitamin deficiency -MRI of brain was negative for acute stroke -EEG somewhat abnormal.   -Subsequently, LTM EEG was normal.  No need for any AEDs as per neurology.  Neurology has signed  off. -Outpatient follow-up with neurology. -Discharge to SNF once bed is available.   Vitamin B12 and folate deficiency -Continue supplementation on the chart.   Acute urinary retention -Foley catheter placed on 03/27/2022.  Mental status has improved: We will try voiding trial today.  If patient goes back into retention, will have to replace the Foley catheter and patient will need outpatient follow-up with urology.   AKI -Treated with IV fluids.  Resolved.   Hypothyroidism -Continue levothyroxine   Hypertension--continue atenolol, amlodipine, losartan   Hyperlipidemia -Resume statin on discharge  Coronary atherosclerosis due to calcified coronary lesion -follows up with Dr. Odis Hollingshead as an outpatient.  Resume aspirin and statin on discharge.   Physical deconditioning -Will need SNF placement as per PT recommendations.     Depression -Currently on Lamictal, lorazepam and quetiapine.  Outpatient follow-up with PCP/psychiatry     Discharge Instructions  Discharge Instructions     Ambulatory referral to Neurology   Complete by: As directed    An appointment is requested in approximately: 2 weeks   Diet - low sodium heart healthy   Complete by: As directed    Increase activity slowly   Complete by: As directed       Allergies as of 04/03/2022       Reactions   Celexa [citalopram Hydrobromide] Rash        Medication List     STOP taking these medications    amantadine 100 MG capsule Commonly known as: SYMMETREL       TAKE these medications    acetaminophen 650 MG CR tablet Commonly known as: TYLENOL Take 1,300 mg by mouth daily as needed for pain.  alendronate 70 MG tablet Commonly known as: FOSAMAX Take 70 mg by mouth once a week.   amLODipine 5 MG tablet Commonly known as: NORVASC Take 5 mg by mouth daily.   aspirin EC 81 MG tablet Take 1 tablet (81 mg total) by mouth daily. Swallow whole.   atenolol 50 MG tablet Commonly known as:  TENORMIN Take 1 tablet (50 mg total) by mouth daily. What changed:  medication strength how much to take when to take this   atorvastatin 40 MG tablet Commonly known as: LIPITOR Take 40 mg by mouth daily. What changed: Another medication with the same name was removed. Continue taking this medication, and follow the directions you see here.   CALCIUM-MAGNESIUM-ZINC PO Take 2 tablets by mouth at bedtime.   cyanocobalamin 1000 MCG tablet Commonly known as: VITAMIN B12 Take 1 tablet (1,000 mcg total) by mouth daily.   DULoxetine 60 MG capsule Commonly known as: CYMBALTA Take 60 mg by mouth daily.   folic acid 1 MG tablet Commonly known as: FOLVITE Take 1 tablet (1 mg total) by mouth daily.   lamoTRIgine 150 MG tablet Commonly known as: LAMICTAL Take 150 mg by mouth daily.   levothyroxine 100 MCG tablet Commonly known as: SYNTHROID Take 50 mcg by mouth every morning.   LORazepam 0.5 MG tablet Commonly known as: ATIVAN Take 1 tablet (0.5 mg total) by mouth 2 (two) times daily as needed for anxiety.   losartan 25 MG tablet Commonly known as: COZAAR Take 25 mg by mouth daily.   multivitamin with minerals Tabs tablet Take 1 tablet by mouth daily.   QUEtiapine 400 MG tablet Commonly known as: SEROQUEL Take 800 mg by mouth at bedtime.   thiamine 100 MG tablet Commonly known as: Vitamin B-1 Take 1 tablet (100 mg total) by mouth daily.   VITAMIN D-3 PO Take 1 capsule by mouth daily.        Follow-up Information     Psychiatrist. Schedule an appointment as soon as possible for a visit in 1 week(s).                 Allergies  Allergen Reactions   Celexa [Citalopram Hydrobromide] Rash    Consultations: Neurology   Procedures/Studies: Overnight EEG with video  Result Date: 04/02/2022 Charlsie Quest, MD     04/02/2022 12:10 PM Patient Name: Veronica Wood MRN: [id number] Epilepsy Attending: Charlsie Quest Referring Physician/Provider: Milon Dikes, MD Duration: 04/01/2022 1524 to 04/02/2022 1120  Patient history: 71yo f with ams. EEG to evaluate for seizure  Level of alertness: Awake, asleep  AEDs during EEG study: LTG  Technical aspects: This EEG study was done with scalp electrodes positioned according to the 10-20 International system of electrode placement. Electrical activity was reviewed with band pass filter of 1-70Hz , sensitivity of 7 uV/mm, display speed of 21mm/sec with a 60Hz  notched filter applied as appropriate. EEG data were recorded continuously and digitally stored.  Video monitoring was available and reviewed as appropriate.  Description: The posterior dominant rhythm consists of 7.5 Hz activity of moderate voltage (25-35 uV) seen predominantly in posterior head regions, symmetric and reactive to eye opening and eye closing. Sleep was characterized by vertex waves, sleep spindles (12 to 14 Hz), maximal frontocentral region. EEG showed intermittent generalized 5 to 7 Hz theta slowing. Hyperventilation and photic stimulation were not performed.    ABNORMALITY - Intermittent slow, generalized  IMPRESSION: This study is suggestive of mild diffuse encephalopathy, nonspecific etiology. No seizures  or definite epileptiform discharges were seen throughout the recording. EEG appears to have improved compared to previous study.  Charlsie Quest    EEG adult  Result Date: 03/31/2022 Charlsie Quest, MD     03/31/2022  5:23 PM Patient Name: Veronica Wood MRN: [id number] Epilepsy Attending: Charlsie Quest Referring Physician/Provider: Lewie Chamber, MD Date: 03/31/2022 Duration: 23.35 mins Patient history: 71yo f with ams. EEG to evaluate for seizure Level of alertness: Awake AEDs during EEG study: Ativan, LTG Technical aspects: This EEG study was done with scalp electrodes positioned according to the 10-20 International system of electrode placement. Electrical activity was reviewed with band pass filter of 1-70Hz , sensitivity of 7 uV/mm,  display speed of 26mm/sec with a  notched filter applied as appropriate. EEG data were recorded continuously and digitally stored.  Video monitoring was available and reviewed as appropriate. Description: No posterior dominant rhythm was seen. EEG showed continuous generalized 3 to 6 Hz theta-delta slowing. Generalized and maximal posterior quadrant periodic discharges with triphasic morphology at 2-2.5Hz  were noted. Hyperventilation and photic stimulation were not performed.   ABNORMALITY - Periodic discharges with triphasic morphology, generalized and maximal posterior quadrant( GPDs) - Continuous slow, generalized IMPRESSION: This study showed generalized and maximal posterior quadrant periodic discharges with triphasic morphology at  2-2.5Hz . This eeg pattern is on the ictal-interictal continuum but can also be seen due to toxic-metabolic causes. Please consider long term monitoring if clinically indicated. Additionally there is moderate diffuse encephalopathy, nonspecific etiology. No seizures were seen throughout the recording. Dr. Wilford Corner was notified. Charlsie Quest   MR BRAIN WO CONTRAST  Result Date: 03/27/2022 CLINICAL DATA:  Neuro deficit, acute, stroke suspected. Encephalopathy. EXAM: MRI HEAD WITHOUT CONTRAST TECHNIQUE: Multiplanar, multiecho pulse sequences of the brain and surrounding structures were obtained without intravenous contrast. COMPARISON:  Head CT 03/26/2022 FINDINGS: Brain: There is no evidence of an acute infarct, intracranial hemorrhage, mass, midline shift, or extra-axial fluid collection. There is advanced cerebral atrophy. Patchy T2 hyperintensities in the cerebral white matter bilaterally are nonspecific but compatible with moderate chronic small vessel ischemic disease. 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 Vascular: Major intracranial vascular flow voids are preserved. Skull and upper cervical spine: Unremarkable bone marrow signal.  Sinuses/Orbits: Bilateral cataract extraction. Paranasal sinuses and mastoid air cells are clear. Other: None. IMPRESSION: 1. No acute intracranial abnormality. 2. 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 Electronically Signed   By: Sebastian Ache M.D.   On: 03/27/2022 14:41   DG Chest 1 View  Result Date: 03/26/2022 CLINICAL DATA:  Cough.  Dysuria.  Drowsiness. EXAM: CHEST  1 VIEW COMPARISON:  None Available. FINDINGS: Trachea is midline. Heart size normal. Lungs are clear. No pleural fluid. IMPRESSION: No acute findings. Electronically Signed   By: Leanna Battles M.D.   On: 03/26/2022 15:59   CT HEAD CODE STROKE WO CONTRAST  Result Date: 03/26/2022 CLINICAL DATA:  Code stroke. Acute neuro deficit. Aphasia rule out stroke EXAM: CT HEAD WITHOUT CONTRAST TECHNIQUE: Contiguous axial images were obtained from the base of the skull through the vertex without intravenous contrast. RADIATION DOSE REDUCTION: This exam was performed according to the departmental dose-optimization program which includes automated exposure control, adjustment of the mA and/or kV according to patient size and/or use of iterative reconstruction technique. COMPARISON:  CT head 07/06/2013 FINDINGS: Brain: Generalized atrophy has progressed. Mild ventricular enlargement consistent with the degree of atrophy. Periventricular white matter hypodensity bilaterally has progressed  in the interval. Negative for acute infarct, hemorrhage, mass Vascular: Negative for hyperdense vessel Skull: Negative Sinuses/Orbits: Paranasal sinuses clear. Bilateral cataract extraction Other: None ASPECTS ([place] Stroke Program Early CT Score) - Ganglionic level infarction (caudate, lentiform nuclei, internal capsule, insula, M1-M3 cortex): 7 - Supraganglionic infarction (M4-M6 cortex): 3 Total score (0-10 with 10 being normal): 10 IMPRESSION: 1. No acute abnormality 2. ASPECTS is 10 3. Progression of atrophy and chronic white  matter changes since 2015 4. These results were called by telephone at the time of interpretation on 03/26/2022 at 2:19 pm to provider [place], who verbally acknowledged these results. Electronically Signed   By: Franchot Gallo M.D.   On: 03/26/2022 14:19      Subjective: Patient seen and examined at bedside.  Poor historian.  No seizures, vomiting, agitation reported.  Discharge Exam: Vitals:   04/03/22 0352 04/03/22 0901  BP: 119/63 110/71  Pulse: 69 77  Resp: 16   Temp: 97.8 F (36.6 C)   SpO2: 98% 100%    General: On room air.  No distress.  Currently on room air.  Slow to respond.  Poor historian. respiratory: Decreased breath sounds at bases bilaterally with some crackles CVS: Currently rate controlled; S1-S2 heard  abdominal: Soft, nontender, slightly distended, no organomegaly; normal bowel sounds are heard  extremities: Trace lower extremity edema; no clubbing.        The results of significant diagnostics from this hospitalization (including imaging, microbiology, ancillary and laboratory) are listed below for reference.     Microbiology: Recent Results (from the past 240 hour(s))  Resp Panel by RT-PCR (Flu A&B, Covid) Urine, In & Out Cath     Status: None   Collection Time: 03/26/22  1:57 PM   Specimen: Urine, In & Out Cath; Nasal Swab  Result Value Ref Range Status   SARS Coronavirus 2 by RT PCR NEGATIVE NEGATIVE Final    Comment: (NOTE) SARS-CoV-2 target nucleic acids are NOT DETECTED.  The SARS-CoV-2 RNA is generally detectable in upper respiratory specimens during the acute phase of infection. The lowest concentration of SARS-CoV-2 viral copies this assay can detect is 138 copies/mL. A negative result does not preclude SARS-Cov-2 infection and should not be used as the sole basis for treatment or other patient management decisions. A negative result may occur with  improper specimen collection/handling, submission of specimen other than nasopharyngeal  swab, presence of viral mutation(s) within the areas targeted by this assay, and inadequate number of viral copies(<138 copies/mL). A negative result must be combined with clinical observations, patient history, and epidemiological information. The expected result is Negative.  Fact Sheet for Patients:  EntrepreneurPulse.com.au  Fact Sheet for Healthcare Providers:  IncredibleEmployment.be  This test is no t yet approved or cleared by the [place] FDA and  has been authorized for detection and/or diagnosis of SARS-CoV-2 by FDA under an Emergency Use Authorization (EUA). This EUA will remain  in effect (meaning this test can be used) for the duration of the COVID-19 declaration under Section 564(b)(1) of the Act, 21 U.S.C.section 360bbb-3(b)(1), unless the authorization is terminated  or revoked sooner.       Influenza A by PCR NEGATIVE NEGATIVE Final   Influenza B by PCR NEGATIVE NEGATIVE Final    Comment: (NOTE) The Xpert Xpress SARS-CoV-2/FLU/RSV plus assay is intended as an aid in the diagnosis of influenza from Nasopharyngeal swab specimens and should not be used as a sole basis for treatment. Nasal washings and aspirates are unacceptable for Xpert Xpress  SARS-CoV-2/FLU/RSV testing.  Fact Sheet for Patients: BloggerCourse.com  Fact Sheet for Healthcare Providers: SeriousBroker.it  This test is not yet approved or cleared by the [place] FDA and has been authorized for detection and/or diagnosis of SARS-CoV-2 by FDA under an Emergency Use Authorization (EUA). This EUA will remain in effect (meaning this test can be used) for the duration of the COVID-19 declaration under Section 564(b)(1) of the Act, 21 U.S.C. section 360bbb-3(b)(1), unless the authorization is terminated or revoked.  Performed at [facility], [address] [place], [place] [address]       Labs: BNP (last 3 results) No results for input(s): "BNP" in the last 8760 hours. Basic Metabolic Panel: Recent Labs  Lab 03/29/22 0611 03/31/22 0503 04/01/22 0512 04/02/22 0609 04/03/22 0445  NA 135 134* 134* 137 134*  K 3.3* 3.9 3.8 3.9 4.2  CL 104 102 104 103 102  CO2 24 26 22 24 26   GLUCOSE 110* 107* 106* 94 98  BUN 12 10 14 9 13   CREATININE 0.81 0.76 1.07* 0.90 0.94  CALCIUM 8.7* 9.1 9.0 9.4 8.7*  MG 1.5* 1.9 2.1 1.9 2.0   Liver Function Tests: No results for input(s): "AST", "ALT", "ALKPHOS", "BILITOT", "PROT", "ALBUMIN" in the last 168 hours. No results for input(s): "LIPASE", "AMYLASE" in the last 168 hours. Recent Labs  Lab 03/30/22 1011  AMMONIA 20   CBC: Recent Labs  Lab 03/29/22 0611 03/31/22 0503 04/01/22 0512 04/02/22 0609 04/03/22 0445  WBC 10.1 8.4 6.5 9.0 7.4  NEUTROABS 7.9* 5.3 4.2 5.2 4.8  HGB 12.2 13.0 12.6 12.7 12.8  HCT 36.6 39.2 38.4 37.5 38.1  MCV 98.7 98.5 101.1* 97.7 98.4  PLT 286 394 330 373 357   Cardiac Enzymes: No results for input(s): "CKTOTAL", "CKMB", "CKMBINDEX", "TROPONINI" in the last 168 hours. BNP: Invalid input(s): "POCBNP" CBG: Recent Labs  Lab 04/01/22 2017 04/02/22 1607  GLUCAP 110* 80   D-Dimer No results for input(s): "DDIMER" in the last 72 hours. Hgb A1c No results for input(s): "HGBA1C" in the last 72 hours. Lipid Profile No results for input(s): "CHOL", "HDL", "LDLCALC", "TRIG", "CHOLHDL", "LDLDIRECT" in the last 72 hours. Thyroid function studies No results for input(s): "TSH", "T4TOTAL", "T3FREE", "THYROIDAB" in the last 72 hours.  Invalid input(s): "FREET3" Anemia work up No results for input(s): "VITAMINB12", "FOLATE", "FERRITIN", "TIBC", "IRON", "RETICCTPCT" in the last 72 hours. Urinalysis    Component Value Date/Time   COLORURINE YELLOW 03/26/2022 1357   APPEARANCEUR CLEAR 03/26/2022 1357   LABSPEC 1.012 03/26/2022 1357   PHURINE 6.0 03/26/2022 1357   GLUCOSEU NEGATIVE 03/26/2022  1357   HGBUR SMALL (A) 03/26/2022 1357   BILIRUBINUR NEGATIVE 03/26/2022 1357   KETONESUR NEGATIVE 03/26/2022 1357   PROTEINUR NEGATIVE 03/26/2022 1357   NITRITE NEGATIVE 03/26/2022 1357   LEUKOCYTESUR NEGATIVE 03/26/2022 1357   Sepsis Labs Recent Labs  Lab 03/31/22 0503 04/01/22 0512 04/02/22 0609 04/03/22 0445  WBC 8.4 6.5 9.0 7.4   Microbiology Recent Results (from the past 240 hour(s))  Resp Panel by RT-PCR (Flu A&B, Covid) Urine, In & Out Cath     Status: None   Collection Time: 03/26/22  1:57 PM   Specimen: Urine, In & Out Cath; Nasal Swab  Result Value Ref Range Status   SARS Coronavirus 2 by RT PCR NEGATIVE NEGATIVE Final    Comment: (NOTE) SARS-CoV-2 target nucleic acids are NOT DETECTED.  The SARS-CoV-2 RNA is generally detectable in upper respiratory specimens during the acute phase of  infection. The lowest concentration of SARS-CoV-2 viral copies this assay can detect is 138 copies/mL. A negative result does not preclude SARS-Cov-2 infection and should not be used as the sole basis for treatment or other patient management decisions. A negative result may occur with  improper specimen collection/handling, submission of specimen other than nasopharyngeal swab, presence of viral mutation(s) within the areas targeted by this assay, and inadequate number of viral copies(<138 copies/mL). A negative result must be combined with clinical observations, patient history, and epidemiological information. The expected result is Negative.  Fact Sheet for Patients:  BloggerCourse.comhttps://www.fda.gov/media/152166/download  Fact Sheet for Healthcare Providers:  SeriousBroker.ithttps://www.fda.gov/media/152162/download  This test is no t yet approved or cleared by the [place] FDA and  has been authorized for detection and/or diagnosis of SARS-CoV-2 by FDA under an Emergency Use Authorization (EUA). This EUA will remain  in effect (meaning this test can be used) for the duration of the COVID-19  declaration under Section 564(b)(1) of the Act, 21 U.S.C.section 360bbb-3(b)(1), unless the authorization is terminated  or revoked sooner.       Influenza A by PCR NEGATIVE NEGATIVE Final   Influenza B by PCR NEGATIVE NEGATIVE Final    Comment: (NOTE) The Xpert Xpress SARS-CoV-2/FLU/RSV plus assay is intended as an aid in the diagnosis of influenza from Nasopharyngeal swab specimens and should not be used as a sole basis for treatment. Nasal washings and aspirates are unacceptable for Xpert Xpress SARS-CoV-2/FLU/RSV testing.  Fact Sheet for Patients: BloggerCourse.comhttps://www.fda.gov/media/152166/download  Fact Sheet for Healthcare Providers: SeriousBroker.ithttps://www.fda.gov/media/152162/download  This test is not yet approved or cleared by the [place] FDA and has been authorized for detection and/or diagnosis of SARS-CoV-2 by FDA under an Emergency Use Authorization (EUA). This EUA will remain in effect (meaning this test can be used) for the duration of the COVID-19 declaration under Section 564(b)(1) of the Act, 21 U.S.C. section 360bbb-3(b)(1), unless the authorization is terminated or revoked.  Performed at [facility], [address] [place], [place] [address]      Time coordinating discharge: 35 minutes  SIGNED:   Glade LloydKshitiz Dotty Gonzalo, MD  [facility] 04/03/2022, 11:57 AM

## 2022-04-04 DIAGNOSIS — I10 Essential (primary) hypertension: Secondary | ICD-10-CM | POA: Diagnosis not present

## 2022-04-04 DIAGNOSIS — F0390 Unspecified dementia without behavioral disturbance: Secondary | ICD-10-CM | POA: Diagnosis not present

## 2022-04-04 DIAGNOSIS — G9340 Encephalopathy, unspecified: Secondary | ICD-10-CM | POA: Diagnosis not present

## 2022-04-04 DIAGNOSIS — R339 Retention of urine, unspecified: Secondary | ICD-10-CM | POA: Diagnosis not present

## 2022-04-07 ENCOUNTER — Other Ambulatory Visit: Payer: Self-pay | Admitting: *Deleted

## 2022-04-07 NOTE — Patient Outreach (Signed)
Mrs. Guse resides in [facility] per [facility]. Screening for potential [facility] care coordination services as benefit of insurance plan and PCP.   Secure communication sent to [facility] SW to make aware writer is following for transition plans potential [facility] needs.   Will continue to follow.    Marthenia Rolling, MSN, RN,BSN [place] Acute Care Coordinator [phone] (Direct dial)

## 2022-04-08 ENCOUNTER — Other Ambulatory Visit: Payer: Self-pay | Admitting: *Deleted

## 2022-04-08 DIAGNOSIS — E782 Mixed hyperlipidemia: Secondary | ICD-10-CM | POA: Diagnosis not present

## 2022-04-08 DIAGNOSIS — F411 Generalized anxiety disorder: Secondary | ICD-10-CM | POA: Diagnosis not present

## 2022-04-08 DIAGNOSIS — G9341 Metabolic encephalopathy: Secondary | ICD-10-CM | POA: Diagnosis not present

## 2022-04-08 DIAGNOSIS — G4700 Insomnia, unspecified: Secondary | ICD-10-CM | POA: Diagnosis not present

## 2022-04-08 DIAGNOSIS — M6281 Muscle weakness (generalized): Secondary | ICD-10-CM | POA: Diagnosis not present

## 2022-04-08 DIAGNOSIS — K5900 Constipation, unspecified: Secondary | ICD-10-CM | POA: Diagnosis not present

## 2022-04-08 DIAGNOSIS — I2510 Atherosclerotic heart disease of native coronary artery without angina pectoris: Secondary | ICD-10-CM | POA: Diagnosis not present

## 2022-04-08 DIAGNOSIS — I10 Essential (primary) hypertension: Secondary | ICD-10-CM | POA: Diagnosis not present

## 2022-04-08 DIAGNOSIS — N390 Urinary tract infection, site not specified: Secondary | ICD-10-CM | POA: Diagnosis not present

## 2022-04-08 DIAGNOSIS — R339 Retention of urine, unspecified: Secondary | ICD-10-CM | POA: Diagnosis not present

## 2022-04-08 DIAGNOSIS — E039 Hypothyroidism, unspecified: Secondary | ICD-10-CM | POA: Diagnosis not present

## 2022-04-08 DIAGNOSIS — M810 Age-related osteoporosis without current pathological fracture: Secondary | ICD-10-CM | POA: Diagnosis not present

## 2022-04-08 NOTE — Patient Outreach (Signed)
[place] Coordinator follow up. Mrs. Ranganathan resides in [facility]. Screening for potential [facility] care coordination needs as benefit of insurance plan and PCP.   Update received from [place], [place] indicating Mrs. Bullard is looking forward to returning home post SNF.   Will continue to follow.   Marthenia Rolling, MSN, RN,BSN [place] Acute Care Coordinator [phone] (Direct dial)

## 2022-04-10 ENCOUNTER — Other Ambulatory Visit: Payer: Self-pay | Admitting: *Deleted

## 2022-04-10 DIAGNOSIS — F03A0 Unspecified dementia, mild, without behavioral disturbance, psychotic disturbance, mood disturbance, and anxiety: Secondary | ICD-10-CM | POA: Diagnosis not present

## 2022-04-10 DIAGNOSIS — I10 Essential (primary) hypertension: Secondary | ICD-10-CM | POA: Diagnosis not present

## 2022-04-10 DIAGNOSIS — E039 Hypothyroidism, unspecified: Secondary | ICD-10-CM | POA: Diagnosis not present

## 2022-04-10 DIAGNOSIS — F411 Generalized anxiety disorder: Secondary | ICD-10-CM | POA: Diagnosis not present

## 2022-04-10 DIAGNOSIS — E782 Mixed hyperlipidemia: Secondary | ICD-10-CM | POA: Diagnosis not present

## 2022-04-10 NOTE — Patient Outreach (Signed)
[facility] Post- Acute Care Coordinator follow up. Veronica Wood resides in [facility]. Veronica Wood was active with [facility] care coordination team recently.   Met with Veronica Wood at beside. States she plans to return home upon SNF discharge. She remains agreeable to [facility] services. Provided [facility] literature and writer's contact information.   Will continue to follow and plan re-referral to [facility] care coordination team upon SNF discharge.  Marthenia Rolling, MSN, RN,BSN [place] Acute Care Coordinator [phone] (Direct dial)

## 2022-04-11 DIAGNOSIS — F0390 Unspecified dementia without behavioral disturbance: Secondary | ICD-10-CM | POA: Diagnosis not present

## 2022-04-11 DIAGNOSIS — G9340 Encephalopathy, unspecified: Secondary | ICD-10-CM | POA: Diagnosis not present

## 2022-04-11 DIAGNOSIS — R339 Retention of urine, unspecified: Secondary | ICD-10-CM | POA: Diagnosis not present

## 2022-04-11 DIAGNOSIS — I10 Essential (primary) hypertension: Secondary | ICD-10-CM | POA: Diagnosis not present

## 2022-04-15 ENCOUNTER — Other Ambulatory Visit: Payer: Self-pay | Admitting: *Deleted

## 2022-04-15 NOTE — Patient Outreach (Signed)
[facility] Post- Acute Care Coordinator follow up. Mrs. Levi resides in [facility] per [facility].   Secure communication sent to [facility] SW to inquire about transition plans/date.   Will plan to refer to [facility] care coordination team upon SNF discharge. Mrs. Lantigua was active with [facility] in the past.   Will continue to follow.    Marthenia Rolling, MSN, RN,BSN [place] Acute Care Coordinator [phone] (Direct dial)

## 2022-04-16 DIAGNOSIS — F411 Generalized anxiety disorder: Secondary | ICD-10-CM | POA: Diagnosis not present

## 2022-04-16 DIAGNOSIS — F03A0 Unspecified dementia, mild, without behavioral disturbance, psychotic disturbance, mood disturbance, and anxiety: Secondary | ICD-10-CM | POA: Diagnosis not present

## 2022-04-16 DIAGNOSIS — F32A Depression, unspecified: Secondary | ICD-10-CM | POA: Diagnosis not present

## 2022-04-16 DIAGNOSIS — I10 Essential (primary) hypertension: Secondary | ICD-10-CM | POA: Diagnosis not present

## 2022-04-16 DIAGNOSIS — M6281 Muscle weakness (generalized): Secondary | ICD-10-CM | POA: Diagnosis not present

## 2022-04-21 DIAGNOSIS — F411 Generalized anxiety disorder: Secondary | ICD-10-CM | POA: Diagnosis not present

## 2022-04-21 DIAGNOSIS — F32A Depression, unspecified: Secondary | ICD-10-CM | POA: Diagnosis not present

## 2022-04-21 DIAGNOSIS — F03A0 Unspecified dementia, mild, without behavioral disturbance, psychotic disturbance, mood disturbance, and anxiety: Secondary | ICD-10-CM | POA: Diagnosis not present

## 2022-04-21 DIAGNOSIS — G9341 Metabolic encephalopathy: Secondary | ICD-10-CM | POA: Diagnosis not present

## 2022-04-21 DIAGNOSIS — I10 Essential (primary) hypertension: Secondary | ICD-10-CM | POA: Diagnosis not present

## 2022-04-22 ENCOUNTER — Other Ambulatory Visit: Payer: Self-pay | Admitting: *Deleted

## 2022-04-22 NOTE — Patient Outreach (Signed)
[place] Coordinator follow up. Screening for potential [facility] care coordination services as benefit of insurance plan and PCP.   Facility site visit to [facility] earlier today. Made aware by front desk staff, Mrs. Ficek discharged from [facility]. Unable to speak with SNF SW to inquire about home health arrangements.  Will plan to re-refer to [facility] care coordination services. Was active with [facility] in the past.   Marthenia Rolling, MSN, RN,BSN [place] Acute Care Coordinator [phone] (Direct dial)

## 2022-04-23 ENCOUNTER — Other Ambulatory Visit: Payer: Self-pay | Admitting: *Deleted

## 2022-04-23 DIAGNOSIS — I10 Essential (primary) hypertension: Secondary | ICD-10-CM

## 2022-04-23 NOTE — Patient Outreach (Signed)
[facility] Post- Acute Care Coordinator follow up. Veronica Wood discharged from [facility] SN[facility] on 04/22/22.  Secure message and voicemail message left for Calvin, SNF SW to inquire about home health agency arrangements.   Veronica Wood was active with [facility] care coordination recently. Will re-refer for [facility] care coordination for post SNF follow up.   Marthenia Rolling, MSN, RN,BSN [place] Acute Care Coordinator [phone] (Direct dial)

## 2022-04-24 ENCOUNTER — Telehealth: Payer: Self-pay | Admitting: *Deleted

## 2022-04-24 DIAGNOSIS — Z87891 Personal history of nicotine dependence: Secondary | ICD-10-CM | POA: Diagnosis not present

## 2022-04-24 DIAGNOSIS — G319 Degenerative disease of nervous system, unspecified: Secondary | ICD-10-CM | POA: Diagnosis not present

## 2022-04-24 DIAGNOSIS — Z7982 Long term (current) use of aspirin: Secondary | ICD-10-CM | POA: Diagnosis not present

## 2022-04-24 DIAGNOSIS — Z9181 History of falling: Secondary | ICD-10-CM | POA: Diagnosis not present

## 2022-04-24 DIAGNOSIS — Z9841 Cataract extraction status, right eye: Secondary | ICD-10-CM | POA: Diagnosis not present

## 2022-04-24 DIAGNOSIS — Z9842 Cataract extraction status, left eye: Secondary | ICD-10-CM | POA: Diagnosis not present

## 2022-04-24 DIAGNOSIS — Z556 Problems related to health literacy: Secondary | ICD-10-CM | POA: Diagnosis not present

## 2022-04-24 DIAGNOSIS — I10 Essential (primary) hypertension: Secondary | ICD-10-CM | POA: Diagnosis not present

## 2022-04-24 DIAGNOSIS — Z604 Social exclusion and rejection: Secondary | ICD-10-CM | POA: Diagnosis not present

## 2022-04-24 DIAGNOSIS — F418 Other specified anxiety disorders: Secondary | ICD-10-CM | POA: Diagnosis not present

## 2022-04-24 DIAGNOSIS — I2584 Coronary atherosclerosis due to calcified coronary lesion: Secondary | ICD-10-CM | POA: Diagnosis not present

## 2022-04-24 DIAGNOSIS — F39 Unspecified mood [affective] disorder: Secondary | ICD-10-CM | POA: Diagnosis not present

## 2022-04-24 DIAGNOSIS — E785 Hyperlipidemia, unspecified: Secondary | ICD-10-CM | POA: Diagnosis not present

## 2022-04-24 DIAGNOSIS — R339 Retention of urine, unspecified: Secondary | ICD-10-CM | POA: Diagnosis not present

## 2022-04-24 DIAGNOSIS — K219 Gastro-esophageal reflux disease without esophagitis: Secondary | ICD-10-CM | POA: Diagnosis not present

## 2022-04-24 DIAGNOSIS — M810 Age-related osteoporosis without current pathological fracture: Secondary | ICD-10-CM | POA: Diagnosis not present

## 2022-04-24 DIAGNOSIS — E538 Deficiency of other specified B group vitamins: Secondary | ICD-10-CM | POA: Diagnosis not present

## 2022-04-24 DIAGNOSIS — I2510 Atherosclerotic heart disease of native coronary artery without angina pectoris: Secondary | ICD-10-CM | POA: Diagnosis not present

## 2022-04-24 DIAGNOSIS — Z7983 Long term (current) use of bisphosphonates: Secondary | ICD-10-CM | POA: Diagnosis not present

## 2022-04-24 DIAGNOSIS — E039 Hypothyroidism, unspecified: Secondary | ICD-10-CM | POA: Diagnosis not present

## 2022-04-24 NOTE — Chronic Care Management (AMB) (Signed)
  Care Coordination   Note   04/24/2022 Name: Veronica Wood MRN: [id number] DOB: [birth year]  Veronica Wood is a 71 y.o. year old female who sees Deland Pretty, MD for primary care. I reached out to Delphina Cahill by phone today to offer care coordination services.  Referral received   Ms. Hershkowitz was given information about Care Coordination services today including:   The Care Coordination services include support from the care team which includes your Nurse Coordinator, Clinical Social Worker, or Pharmacist.  The Care Coordination team is here to help remove barriers to the health concerns and goals most important to you. Care Coordination services are voluntary, and the patient may decline or stop services at any time by request to their care team member.   Care Coordination Consent Status: Patient agreed to services and verbal consent obtained.   Follow up plan:  Telephone appointment with care coordination team member scheduled for:  05/12/2022  Encounter Outcome:  Pt. Scheduled from referral   Julian Hy, Pink Direct Dial: [phone]

## 2022-04-30 DIAGNOSIS — G319 Degenerative disease of nervous system, unspecified: Secondary | ICD-10-CM | POA: Diagnosis not present

## 2022-04-30 DIAGNOSIS — I10 Essential (primary) hypertension: Secondary | ICD-10-CM | POA: Diagnosis not present

## 2022-04-30 DIAGNOSIS — I2510 Atherosclerotic heart disease of native coronary artery without angina pectoris: Secondary | ICD-10-CM | POA: Diagnosis not present

## 2022-04-30 DIAGNOSIS — I2584 Coronary atherosclerosis due to calcified coronary lesion: Secondary | ICD-10-CM | POA: Diagnosis not present

## 2022-04-30 DIAGNOSIS — F418 Other specified anxiety disorders: Secondary | ICD-10-CM | POA: Diagnosis not present

## 2022-04-30 DIAGNOSIS — F39 Unspecified mood [affective] disorder: Secondary | ICD-10-CM | POA: Diagnosis not present

## 2022-05-02 DIAGNOSIS — I10 Essential (primary) hypertension: Secondary | ICD-10-CM | POA: Diagnosis not present

## 2022-05-02 DIAGNOSIS — G319 Degenerative disease of nervous system, unspecified: Secondary | ICD-10-CM | POA: Diagnosis not present

## 2022-05-02 DIAGNOSIS — I2510 Atherosclerotic heart disease of native coronary artery without angina pectoris: Secondary | ICD-10-CM | POA: Diagnosis not present

## 2022-05-02 DIAGNOSIS — F418 Other specified anxiety disorders: Secondary | ICD-10-CM | POA: Diagnosis not present

## 2022-05-02 DIAGNOSIS — F39 Unspecified mood [affective] disorder: Secondary | ICD-10-CM | POA: Diagnosis not present

## 2022-05-02 DIAGNOSIS — I2584 Coronary atherosclerosis due to calcified coronary lesion: Secondary | ICD-10-CM | POA: Diagnosis not present

## 2022-05-06 ENCOUNTER — Encounter: Payer: Self-pay | Admitting: Neurology

## 2022-05-06 ENCOUNTER — Ambulatory Visit (INDEPENDENT_AMBULATORY_CARE_PROVIDER_SITE_OTHER): Payer: Medicare Other | Admitting: Neurology

## 2022-05-06 VITALS — BP 147/79 | HR 62 | Ht 66.0 in | Wt 160.0 lb

## 2022-05-06 DIAGNOSIS — Z8669 Personal history of other diseases of the nervous system and sense organs: Secondary | ICD-10-CM

## 2022-05-06 MED ORDER — THIAMINE HCL 100 MG PO TABS: 100.0000 mg | ORAL_TABLET | Freq: Every day | ORAL | 3 refills | Status: AC

## 2022-05-06 MED ORDER — FOLIC ACID 1 MG PO TABS: 1.0000 mg | ORAL_TABLET | Freq: Every day | ORAL | 3 refills | Status: AC

## 2022-05-06 NOTE — Patient Instructions (Signed)
Continue current medications including multivitamins  Continue to follow up with PCP  Return as needed

## 2022-05-06 NOTE — Progress Notes (Signed)
[facility]  PATIENT: Veronica Wood DOB: October 22, 1950  REQUESTING CLINICIAN: Aline August, MD HISTORY FROM: Patient, husband and chart review  REASON FOR VISIT: Metabolic encephalopathy    HISTORICAL  CHIEF COMPLAINT:  Chief Complaint  Patient presents with   New Patient (Initial Visit)    Rm 12  NP/internal hospital referral for acute metabolic encephalopathy    HISTORY OF PRESENT ILLNESS:  This is a 71 year old woman past medical history of hypertension, hyperlipidemia, depression, hypothyroidism who is presenting after being admitted to the hospital for acute mental status changes.  Patient reports on the day of the presentation she was not feeling like her normal self, she could not get up and she could not move.  She felt generally weak.  In the ED her work-up has been negative, negative CT head, negative urinalysis for infection but she was found to have a low B12.  Her EEG was negative for any acute stroke but initially show generalized slowing with triphasic waves which later improved to just generalized slowing. She was felt to be encephalopathy due to metabolic derangement or medication side effect.  Her mental status improved and she was later discharged  Husband reports that patient is almost back to her normal self since being discharged. Currently they do not have any concern or any additional complaints.  Hospital course below.     Hospital course/Discharge Summary  71 yo female with PMH anxiety, HTN, HLD, depression, thyroid disease who was brought into the hospital due to confusion, feeling drowsy, and falling out of chair at home.  On presentation, CT of the head was negative for acute changes.  She had recently been started on amantadine on 02/24/2022 for 30-day supply for trial of suppressing tremors (no significant improvement in tremors per husband).  UDS was negative.  UA was negative for signs of infection.  TSH was normal, ammonia negative.   Neurology was consulted.  MRI of brain was negative for acute stroke.  B12 and folate levels were low: Supplementation started.  EEG somewhat abnormal: Patient was transferred to [facility] for LTM EEG.  LTM EEG was negative for seizures.  Neurology signed off and recommended no AEDs and recommended to treat vitamin deficiency. PT recommended SNF placement.  She will be discharged to SNF once bed is available.  Acute metabolic encephalopathy -Questionable cause.  Normal TSH and ammonia.  Negative UDS and UA. -?  Medication side effect (possibly from amantadine versus recent trial of increased dose of Seroquel) versus dementia versus nutrient/vitamin deficiency -MRI of brain was negative for acute stroke -EEG somewhat abnormal.   -Subsequently, LTM EEG was normal.  No need for any AEDs as per neurology.  Neurology has signed off. -Outpatient follow-up with neurology. -Discharge to SNF once bed is available.  OTHER MEDICAL CONDITIONS: Hypertension, Hyperlipidemia, Anxiety, Hypothyroidism    REVIEW OF SYSTEMS: Full 14 system review of systems performed and negative with exception of: As noted in the HPI   ALLERGIES: Allergies  Allergen Reactions   Celexa [Citalopram Hydrobromide] Rash    HOME MEDICATIONS: Outpatient Medications Prior to Visit  Medication Sig Dispense Refill   acetaminophen (TYLENOL) 650 MG CR tablet Take 1,300 mg by mouth daily as needed for pain.     alendronate (FOSAMAX) 70 MG tablet Take 70 mg by mouth once a week.     amLODipine (NORVASC) 5 MG tablet Take 5 mg by mouth daily.     aspirin EC 81 MG tablet Take 1 tablet (81 mg  total) by mouth daily. Swallow whole. 90 tablet 3   atenolol (TENORMIN) 50 MG tablet Take 1 tablet (50 mg total) by mouth daily. 30 tablet 3   atorvastatin (LIPITOR) 40 MG tablet Take 40 mg by mouth daily.     CALCIUM-MAGNESIUM-ZINC PO Take 2 tablets by mouth at bedtime.     Cholecalciferol (VITAMIN D-3 PO) Take 1 capsule by mouth daily.      cyanocobalamin (VITAMIN B12) 1000 MCG tablet Take 1 tablet (1,000 mcg total) by mouth daily.     DULoxetine (CYMBALTA) 60 MG capsule Take 60 mg by mouth daily.  4   lamoTRIgine (LAMICTAL) 150 MG tablet Take 150 mg by mouth daily.     levothyroxine (SYNTHROID, LEVOTHROID) 100 MCG tablet Take 50 mcg by mouth every morning.   0   LORazepam (ATIVAN) 0.5 MG tablet Take 1 tablet (0.5 mg total) by mouth 2 (two) times daily as needed for anxiety. 10 tablet 0   losartan (COZAAR) 25 MG tablet Take 25 mg by mouth daily.     QUEtiapine (SEROQUEL) 400 MG tablet Take 800 mg by mouth at bedtime.     folic acid (FOLVITE) 1 MG tablet Take 1 tablet (1 mg total) by mouth daily.     Multiple Vitamin (MULTIVITAMIN WITH MINERALS) TABS tablet Take 1 tablet by mouth daily.     thiamine (VITAMIN B-1) 100 MG tablet Take 1 tablet (100 mg total) by mouth daily.     No facility-administered medications prior to visit.    PAST MEDICAL HISTORY: Past Medical History:  Diagnosis Date   Anxiety    Coronary atherosclerosis due to calcified coronary lesion    Depression    Hyperlipidemia    Hypertension    Thyroid disease     PAST SURGICAL HISTORY: Past Surgical History:  Procedure Laterality Date   COLONOSCOPY     DILATION AND CURETTAGE OF UTERUS      FAMILY HISTORY: Family History  Problem Relation Age of Onset   Stroke Mother    Pulmonary embolism Father     SOCIAL HISTORY: Social History   Socioeconomic History   Marital status: Married    Spouse name: Not on file   Number of children: 0   Years of education: Not on file   Highest education level: Not on file  Occupational History   Not on file  Tobacco Use   Smoking status: Former    Packs/day: 2.00    Years: 20.00    Total pack years: 40.00    Types: Cigarettes    Quit date: 07/27/1985    Years since quitting: 36.8   Smokeless tobacco: Never  Vaping Use   Vaping Use: Never used  Substance and Sexual Activity   Alcohol use: Yes     Alcohol/week: 1.0 standard drink of alcohol    Types: 1 Shots of liquor per week    Comment: Occasionally in the evening   Drug use: Never   Sexual activity: Not on file  Other Topics Concern   Not on file  Social History Narrative   Not on file   Social Determinants of Health   Financial Resource Strain: Not on file  Food Insecurity: Not on file  Transportation Needs: Not on file  Physical Activity: Not on file  Stress: Not on file  Social Connections: Not on file  Intimate Partner Violence: Not on file    PHYSICAL EXAM  GENERAL EXAM/CONSTITUTIONAL: Vitals:  Vitals:   05/06/22 0948  BP: (!) 147/79  Pulse: 62  Weight: 160 lb (72.6 kg)  Height: _0  (1.676 m)   Body mass index is 25.82 kg/m. Wt Readings from Last 3 Encounters:  05/06/22 160 lb (72.6 kg)  03/26/22 158 lb 11.7 oz (72 kg)  02/21/21 160 lb 3.2 oz (72.7 kg)   Patient is in no distress; well developed, nourished and groomed; neck is supple  EYES: Pupils round and reactive to light, Visual fields full to confrontation, Extraocular movements intacts,   MUSCULOSKELETAL: Gait, strength, tone, movements noted in Neurologic exam below  NEUROLOGIC: MENTAL STATUS:      No data to display         awake, alert, oriented to person, place and time recent and remote memory intact normal attention and concentration language fluent, comprehension intact, naming intact fund of knowledge appropriate  CRANIAL NERVE:  2nd, 3rd, 4th, 6th - pupils equal and reactive to light, visual fields full to confrontation, extraocular muscles intact, no nystagmus 5th - facial sensation symmetric 7th - facial strength symmetric 8th - hearing intact 9th - palate elevates symmetrically, uvula midline 11th - shoulder shrug symmetric 12th - tongue protrusion midline  MOTOR:  normal bulk and tone, full strength in the BUE, BLE  SENSORY:  normal and symmetric to light touch  COORDINATION:  finger-nose-finger, fine  finger movements normal  REFLEXES:  deep tendon reflexes present and symmetric  GAIT/STATION:  normal   DIAGNOSTIC DATA (LABS, IMAGING, TESTING) - I reviewed patient records, labs, notes, testing and imaging myself where available.  Lab Results  Component Value Date   WBC 7.4 04/03/2022   HGB 12.8 04/03/2022   HCT 38.1 04/03/2022   MCV 98.4 04/03/2022   PLT 357 04/03/2022      Component Value Date/Time   NA 134 (L) 04/03/2022 0445   NA 136 08/28/2020 0812   K 4.2 04/03/2022 0445   CL 102 04/03/2022 0445   CO2 26 04/03/2022 0445   GLUCOSE 98 04/03/2022 0445   BUN 13 04/03/2022 0445   BUN 8 08/28/2020 0812   CREATININE 0.94 04/03/2022 0445   CALCIUM 8.7 (L) 04/03/2022 0445   PROT 6.1 (L) 03/27/2022 0415   PROT 7.4 08/28/2020 0812   ALBUMIN 3.7 03/27/2022 0415   ALBUMIN 4.9 (H) 08/28/2020 0812   AST 68 (H) 03/27/2022 0415   ALT 31 03/27/2022 0415   ALKPHOS 59 03/27/2022 0415   BILITOT 0.8 03/27/2022 0415   BILITOT 0.4 08/28/2020 0812   GFRNONAA >60 04/03/2022 0445   GFRAA  03/06/2010 2056    >60        The eGFR has been calculated using the MDRD equation. This calculation has not been validated in all clinical situations. eGFR's persistently <60 mL/min signify possible Chronic Kidney Disease.   Lab Results  Component Value Date   CHOL 217 (H) 08/28/2020   HDL 115 08/28/2020   LDLCALC 92 08/28/2020   LDLDIRECT 72 08/28/2020   TRIG 57 08/28/2020   No results found for: "HGBA1C" Lab Results  Component Value Date   VITAMINB12 181 03/26/2022   Lab Results  Component Value Date   TSH 0.665 03/26/2022    MRI Brain 03/27/22 1. No acute intracranial abnormality. 2. 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  EEG 04/02/22 This study is suggestive of mild diffuse encephalopathy, nonspecific etiology. No seizures or definite epileptiform discharges were seen throughout the recording.    ASSESSMENT AND PLAN  71 y.o.  year old female with anxiety/depression, hypertension, hyperlipidemia, hypothyroidism  who is presenting after being discharged from the hospital due to metabolic encephalopathy.  Since discharge from the hospital husband reported patient is almost back to her normal self. She is compliant with her medication.  Currently no complaints or concerns.  At the moment we will advise patient to continue her current medication including her multivitamins, continue to follow with PCP and return as needed.   1. History of encephalopathy      Patient Instructions  Continue current medications including multivitamins  Continue to follow up with PCP  Return as needed   No orders of the defined types were placed in this encounter.   Meds ordered this encounter  Medications   thiamine (VITAMIN B1) 100 MG tablet    Sig: Take 1 tablet (100 mg total) by mouth daily.    Dispense:  90 tablet    Refill:  3   folic acid (FOLVITE) 1 MG tablet    Sig: Take 1 tablet (1 mg total) by mouth daily.    Dispense:  90 tablet    Refill:  3    Return if symptoms worsen or fail to improve.  I have spent a total of 45 minutes dedicated to this patient today, preparing to see patient, performing a medically appropriate examination and evaluation, ordering tests and/or medications and procedures, and counseling and educating the patient/family/caregiver; independently interpreting result and communicating results to the family/patient/caregiver; and documenting clinical information in the electronic medical record.   Alric Ran, MD 05/06/2022, 1:54 PM  [facility] [address] [place], [place] [address] [phone]

## 2022-05-07 DIAGNOSIS — G319 Degenerative disease of nervous system, unspecified: Secondary | ICD-10-CM | POA: Diagnosis not present

## 2022-05-07 DIAGNOSIS — F39 Unspecified mood [affective] disorder: Secondary | ICD-10-CM | POA: Diagnosis not present

## 2022-05-07 DIAGNOSIS — I2584 Coronary atherosclerosis due to calcified coronary lesion: Secondary | ICD-10-CM | POA: Diagnosis not present

## 2022-05-07 DIAGNOSIS — I10 Essential (primary) hypertension: Secondary | ICD-10-CM | POA: Diagnosis not present

## 2022-05-07 DIAGNOSIS — I2510 Atherosclerotic heart disease of native coronary artery without angina pectoris: Secondary | ICD-10-CM | POA: Diagnosis not present

## 2022-05-07 DIAGNOSIS — F418 Other specified anxiety disorders: Secondary | ICD-10-CM | POA: Diagnosis not present

## 2022-05-12 ENCOUNTER — Ambulatory Visit: Payer: Self-pay

## 2022-05-12 NOTE — Patient Instructions (Addendum)
Visit Information  Thank you for taking time to visit with me today. Please don't hesitate to contact me if I can be of assistance to you.   Following are the goals we discussed today:   Goals Addressed               This Visit's Progress     Patient Stated     I am worried about my blood pressure (pt-stated)        Care Coordination Interventions: Evaluation of current treatment plan related to hypertension self management and patient's adherence to plan as established by provider Advised patient, providing education and rationale, to monitor blood pressure daily and record, calling PCP for findings outside established parameters Provided education on prescribed diet low Sodium  Discussed complications of poorly controlled blood pressure such as heart disease, stroke, circulatory complications, vision complications, kidney impairment, sexual dysfunction Mailed printed educational materials to patient related to Self Monitoring BP at Home, What is High Blood Pressure?; Why Should I Limit Sodium?         COMPLETED: My symptoms have resolved following a recent hospital admission (pt-stated)        Care Coordination Interventions: Evaluation of current treatment plan related to Acute metabolic encephalopathy and patient's adherence to plan as established by provider Determined patient has completed a Neurology follow up post hospital admission as directed, she is scheduled to see her PCP on 05/14/22 Review of patient status, including review of consultant's reports, relevant laboratory and other test results, and medications completed Determined patient feels her symptoms have resolved at this time and she is progressing toward wellness            Our next appointment is by telephone on 06/11/22 at 1130 AM  Please call the care guide team at [phone] if you need to cancel or reschedule your appointment.   If you are experiencing a Mental Health or Behavioral Health Crisis  or need someone to talk to, please call 1-800-273-TALK (toll free, 24 hour hotline)  The patient verbalized understanding of instructions, educational materials, and care plan provided today and agreed to receive a mailed copy of patient instructions, educational materials, and care plan.   Delsa Sale, RN, BSN, CCM Care Management Coordinator [facility] Care Management Direct Phone: [phone]

## 2022-05-12 NOTE — Patient Outreach (Signed)
  Care Coordination   Initial Visit Note   05/12/2022 Name: Veronica Wood MRN: [id number] DOB: [birth year]  Veronica Wood is a 71 y.o. year old female who sees Merri Brunette, MD for primary care. I spoke with  Veronica Wood by phone today.  What matters to the patients health and wellness today?  Patient feels she has made a complete recovery from her recent hospital admission. She would like to focus on keep her blood pressure under good control.     Goals Addressed               This Visit's Progress     Patient Stated     I am worried about my blood pressure (pt-stated)        Care Coordination Interventions: Evaluation of current treatment plan related to hypertension self management and patient's adherence to plan as established by provider Advised patient, providing education and rationale, to monitor blood pressure daily and record, calling PCP for findings outside established parameters Provided education on prescribed diet low Sodium  Discussed complications of poorly controlled blood pressure such as heart disease, stroke, circulatory complications, vision complications, kidney impairment, sexual dysfunction Mailed printed educational materials to patient related to Self Monitoring BP at Home, What is High Blood Pressure?; Why Should I Limit Sodium?         COMPLETED: My symptoms have resolved following a recent hospital admission (pt-stated)        Care Coordination Interventions: Evaluation of current treatment plan related to Acute metabolic encephalopathy and patient's adherence to plan as established by provider Determined patient has completed a Neurology follow up post hospital admission as directed, she is scheduled to see her PCP on 05/14/22 Review of patient status, including review of consultant's reports, relevant laboratory and other test results, and medications completed Determined patient feels her symptoms have resolved at this time and she is  progressing toward wellness            SDOH assessments and interventions completed:  Yes  SDOH Interventions Today    Flowsheet Row Most Recent Value  SDOH Interventions   Transportation Interventions Intervention Not Indicated        Care Coordination Interventions Activated:  Yes  Care Coordination Interventions:  Yes, provided   Follow up plan: Follow up call scheduled for 06/11/22 @11 :30 AM    Encounter Outcome:  Pt. Visit Completed

## 2022-05-14 DIAGNOSIS — R300 Dysuria: Secondary | ICD-10-CM | POA: Diagnosis not present

## 2022-05-14 DIAGNOSIS — G9341 Metabolic encephalopathy: Secondary | ICD-10-CM | POA: Diagnosis not present

## 2022-05-14 DIAGNOSIS — Z23 Encounter for immunization: Secondary | ICD-10-CM | POA: Diagnosis not present

## 2022-05-15 DIAGNOSIS — I2584 Coronary atherosclerosis due to calcified coronary lesion: Secondary | ICD-10-CM | POA: Diagnosis not present

## 2022-05-15 DIAGNOSIS — F418 Other specified anxiety disorders: Secondary | ICD-10-CM | POA: Diagnosis not present

## 2022-05-15 DIAGNOSIS — I10 Essential (primary) hypertension: Secondary | ICD-10-CM | POA: Diagnosis not present

## 2022-05-15 DIAGNOSIS — G319 Degenerative disease of nervous system, unspecified: Secondary | ICD-10-CM | POA: Diagnosis not present

## 2022-05-15 DIAGNOSIS — I2510 Atherosclerotic heart disease of native coronary artery without angina pectoris: Secondary | ICD-10-CM | POA: Diagnosis not present

## 2022-05-15 DIAGNOSIS — F39 Unspecified mood [affective] disorder: Secondary | ICD-10-CM | POA: Diagnosis not present

## 2022-05-20 DIAGNOSIS — R5381 Other malaise: Secondary | ICD-10-CM | POA: Diagnosis not present

## 2022-05-20 DIAGNOSIS — E039 Hypothyroidism, unspecified: Secondary | ICD-10-CM | POA: Diagnosis not present

## 2022-05-20 DIAGNOSIS — Z7983 Long term (current) use of bisphosphonates: Secondary | ICD-10-CM | POA: Diagnosis not present

## 2022-05-20 DIAGNOSIS — F418 Other specified anxiety disorders: Secondary | ICD-10-CM | POA: Diagnosis not present

## 2022-06-11 ENCOUNTER — Ambulatory Visit: Payer: Self-pay

## 2022-06-11 NOTE — Patient Instructions (Signed)
Visit Information  Thank you for taking time to visit with me today. Please don't hesitate to contact me if I can be of assistance to you.   Following are the goals we discussed today:   Goals Addressed               This Visit's Progress     Patient Stated     COMPLETED: I am worried about my blood pressure (pt-stated)        Care Coordination Interventions: Evaluation of current treatment plan related to hypertension self management and patient's adherence to plan as established by provider Determined patient is monitoring her BP at home with readings at goal BP <130/80 Confirmed patient received and reviewed the printed educational materials to patient related to Self Monitoring BP at Home, What is High Blood Pressure?; Why Should I Limit Sodium?, she denies questions at this time Discussed with patient she has resumed home exercises, aiming to do daily  Educated patient regarding the benefits of light cardio and weight lifting           Our next appointment is by telephone on 08/12/22 at 0930 AM  Please call the care guide team at [phone] if you need to cancel or reschedule your appointment.   If you are experiencing a Mental Health or Behavioral Health Crisis or need someone to talk to, please call 1-800-273-TALK (toll free, 24 hour hotline)  The patient verbalized understanding of instructions, educational materials, and care plan provided today and agreed to receive a mailed copy of patient instructions, educational materials, and care plan.   Delsa Sale, RN, BSN, CCM Care Management Coordinator [facility] Care Management Direct Phone: [phone]

## 2022-06-11 NOTE — Patient Outreach (Signed)
  Care Coordination   Follow Up Visit Note   06/11/2022 Name: Veronica Wood MRN: [id number] DOB: May 24, 1951  Veronica Wood is a 71 y.o. year old female who sees Merri Brunette, MD for primary care. I spoke with  [name] by phone today.  What matters to the patients health and wellness today?  Patient will continue to monitor her BP at home. She will participate in her HEP daily as directed and will implement both cardio and light weight lifting.     Goals Addressed               This Visit's Progress     Patient Stated     COMPLETED: I am worried about my blood pressure (pt-stated)        Care Coordination Interventions: Evaluation of current treatment plan related to hypertension self management and patient's adherence to plan as established by provider Determined patient is monitoring her BP at home with readings at goal BP <130/80 Confirmed patient received and reviewed the printed educational materials to patient related to Self Monitoring BP at Home, What is High Blood Pressure?; Why Should I Limit Sodium?, she denies questions at this time Discussed with patient she has resumed home exercises, aiming to do daily  Educated patient regarding the benefits of light cardio and weight lifting           SDOH assessments and interventions completed:  No     Care Coordination Interventions:  Yes, provided   Follow up plan: Follow up call scheduled for 08/12/22 @0930  AM    Encounter Outcome:  Pt. Visit Completed

## 2022-06-25 ENCOUNTER — Encounter: Payer: Self-pay | Admitting: Cardiology

## 2022-06-25 ENCOUNTER — Ambulatory Visit: Payer: Medicare Other | Admitting: Cardiology

## 2022-06-25 VITALS — BP 114/76 | HR 73 | Resp 14 | Ht 66.0 in | Wt 157.8 lb

## 2022-06-25 DIAGNOSIS — Z87891 Personal history of nicotine dependence: Secondary | ICD-10-CM | POA: Diagnosis not present

## 2022-06-25 DIAGNOSIS — I10 Essential (primary) hypertension: Secondary | ICD-10-CM

## 2022-06-25 DIAGNOSIS — E7800 Pure hypercholesterolemia, unspecified: Secondary | ICD-10-CM | POA: Diagnosis not present

## 2022-06-25 DIAGNOSIS — I2510 Atherosclerotic heart disease of native coronary artery without angina pectoris: Secondary | ICD-10-CM | POA: Diagnosis not present

## 2022-06-25 DIAGNOSIS — I2584 Coronary atherosclerosis due to calcified coronary lesion: Secondary | ICD-10-CM | POA: Diagnosis not present

## 2022-06-25 NOTE — Progress Notes (Signed)
Date:  06/25/2022   ID:  Veronica Wood, DOB [birth year], MRN [id number]  PCP:  Deland Pretty, MD  Cardiologist:  Rex Kras, DO, [facility] (established care 07/27/2020)  Date: 06/25/22 Last Office Visit: 02/21/2021   Chief Complaint  Patient presents with   Follow-up    16 months  Coronary artery calcification    HPI  Veronica Wood is a 71 y.o. female whose past medical history and cardiovascular risk factors include: Hypertension, hypercholesterolemia, moderate coronary artery calcification, hypothyroidism, former smoker, advanced age, postmenopausal female.   Patient was referred to the practice for evaluation of CAD.  Given her hyperlipidemia she had undergone CAC with PCP for further risk stratification.  She was noted to have moderate CAC and was referred to cardiology.  She underwent appropriate ischemic workup as outlined below and has been following up on an annual basis.  Since last office visit, doing well from a cardiovascular standpoint.  No hospitalizations or urgent care visits for cardiovascular reasons.  She has an appointment with PCP in the coming month for annual well visit and will have routine labs performed at that time.  Overall functional capacity remains stable  ALLERGIES: Allergies  Allergen Reactions   Celexa [Citalopram Hydrobromide] Rash    MEDICATION LIST PRIOR TO VISIT: Current Meds  Medication Sig   acetaminophen (TYLENOL) 650 MG CR tablet Take 1,300 mg by mouth daily as needed for pain.   alendronate (FOSAMAX) 70 MG tablet Take 70 mg by mouth once a week.   amLODipine (NORVASC) 5 MG tablet Take 5 mg by mouth daily.   aspirin EC 81 MG tablet Take 1 tablet (81 mg total) by mouth daily. Swallow whole.   atenolol (TENORMIN) 50 MG tablet Take 1 tablet (50 mg total) by mouth daily.   atorvastatin (LIPITOR) 40 MG tablet Take 40 mg by mouth daily.   CALCIUM-MAGNESIUM-ZINC PO Take 2 tablets by mouth at bedtime.   Cholecalciferol (VITAMIN D-3 PO)  Take 1 capsule by mouth daily.   cyanocobalamin (VITAMIN B12) 1000 MCG tablet Take 1 tablet (1,000 mcg total) by mouth daily.   DULoxetine (CYMBALTA) 60 MG capsule Take 60 mg by mouth daily.   folic acid (FOLVITE) 1 MG tablet Take 1 tablet (1 mg total) by mouth daily.   lamoTRIgine (LAMICTAL) 150 MG tablet Take 150 mg by mouth daily.   levothyroxine (SYNTHROID, LEVOTHROID) 100 MCG tablet Take 50 mcg by mouth every morning.    LORazepam (ATIVAN) 0.5 MG tablet Take 1 tablet (0.5 mg total) by mouth 2 (two) times daily as needed for anxiety.   losartan (COZAAR) 25 MG tablet Take 25 mg by mouth daily.   QUEtiapine (SEROQUEL) 400 MG tablet Take 800 mg by mouth at bedtime.   thiamine (VITAMIN B1) 100 MG tablet Take 1 tablet (100 mg total) by mouth daily.     PAST MEDICAL HISTORY: Past Medical History:  Diagnosis Date   Anxiety    Coronary atherosclerosis due to calcified coronary lesion    Depression    Hyperlipidemia    Hypertension    Thyroid disease     PAST SURGICAL HISTORY: Past Surgical History:  Procedure Laterality Date   COLONOSCOPY     DILATION AND CURETTAGE OF UTERUS      FAMILY HISTORY: The patient family history includes Pulmonary embolism in her father; Stroke in her mother.  SOCIAL HISTORY:  The patient  reports that she quit smoking about 36 years ago. Her smoking use included cigarettes. She has a  40.00 pack-year smoking history. She has never used smokeless tobacco. She reports current alcohol use of about 1.0 standard drink of alcohol per week. She reports that she does not use drugs.  REVIEW OF SYSTEMS: Review of Systems  Constitutional: Negative for chills and fever.  HENT:  Negative for hoarse voice and nosebleeds.   Eyes:  Negative for discharge, double vision and pain.  Cardiovascular:  Negative for chest pain, claudication, dyspnea on exertion, leg swelling, near-syncope, orthopnea, palpitations, paroxysmal nocturnal dyspnea and syncope.  Respiratory:   Negative for hemoptysis and shortness of breath.   Musculoskeletal:  Negative for muscle cramps and myalgias.  Gastrointestinal:  Negative for abdominal pain, constipation, diarrhea, hematemesis, hematochezia, melena, nausea and vomiting.  Neurological:  Negative for dizziness and light-headedness.    PHYSICAL EXAM:    06/25/2022    9:49 AM 05/06/2022    9:48 AM 04/03/2022    4:10 PM  Vitals with BMI  Height _0  _1    Weight 157 lbs 13 oz 160 lbs   BMI 84.66 59.93   Systolic 570 177 939  Diastolic 76 79 74  Pulse 73 62 87   Physical Exam  Constitutional: No distress.  Age appropriate, hemodynamically stable.   Neck: No JVD present.  Cardiovascular: Normal rate, regular rhythm, S1 normal, S2 normal, intact distal pulses and normal pulses. Exam reveals no gallop, no S3 and no S4.  No murmur heard. Pulses:      Dorsalis pedis pulses are 2+ on the right side and 2+ on the left side.       Posterior tibial pulses are 2+ on the right side and 2+ on the left side.  Pulmonary/Chest: Effort normal and breath sounds normal. No stridor. She has no wheezes. She has no rales.  Abdominal: Soft. Bowel sounds are normal. She exhibits no distension. There is no abdominal tenderness.  Musculoskeletal:        General: No edema.     Cervical back: Neck supple.  Neurological: She is alert and oriented to person, place, and time. She has intact cranial nerves (2-12).  Skin: Skin is warm and moist.   CARDIAC DATABASE: EKG: 06/25/2022: Sinus rhythm, 71 bpm, without underlying ischemia or injury pattern.  Echocardiogram: 08/02/2020:  Normal LV systolic function with visual EF 55-60%. Left ventricle cavity  is normal in size. Normal global wall motion. Normal diastolic filling  pattern, normal LAP.  Mild (Grade I) mitral regurgitation.  No other significant valvular abnormalities.  No prior study for comparison.   Coronary artery calcification scoring performed on 07/17/2020 at [facility]: Left main: 0 Left anterior descending:43 Left circumflex:103 Right coronary artery:0 Total calcium score: 152 AU  Stress Testing: Lexiscan/modified Bruce Tetrofosmin stress test 08/06/2020: Lexiscan/modified Bruce nuclear stress test performed using 1-day protocol. patient reached 64% of MPHR and achieved 1 MET.  Stress EKG is non-diagnostic, as this is pharmacological stress test. In addition, stress EKG showed sinus rhythm, nonspecific ST-T changes inferior leads.  Normal myocardial perfusion. Stress LVEF 73%. Low risk study.   Heart Catheterization: None  LABORATORY DATA:    Latest Ref Rng & Units 04/03/2022    4:45 AM 04/02/2022    6:09 AM 04/01/2022    5:12 AM  CBC  WBC 4.0 - 10.5 K/uL 7.4  9.0  6.5   Hemoglobin 12.0 - 15.0 g/dL 12.8  12.7  12.6   Hematocrit 36.0 - 46.0 % 38.1  37.5  38.4   Platelets 150 - 400 K/uL 357  373  330        Latest Ref Rng & Units 04/03/2022    4:45 AM 04/02/2022    6:09 AM 04/01/2022    5:12 AM  CMP  Glucose 70 - 99 mg/dL 98  94  106   BUN 8 - 23 mg/dL _0 Creatinine 0.44 - 1.00 mg/dL 0.94  0.90  1.07   Sodium 135 - 145 mmol/L 134  137  134   Potassium 3.5 - 5.1 mmol/L 4.2  3.9  3.8   Chloride 98 - 111 mmol/L 102  103  104   CO2 22 - 32 mmol/L _1 Calcium 8.9 - 10.3 mg/dL 8.7  9.4  9.0     Lipid Panel     Component Value Date/Time   CHOL 217 (H) 08/28/2020 0812   TRIG 57 08/28/2020 0812   HDL 115 08/28/2020 0812   LDLCALC 92 08/28/2020 0812   LDLDIRECT 72 08/28/2020 0812   LABVLDL 10 08/28/2020 0812    No components found for: "NTPROBNP" No results for input(s): "PROBNP" in the last 8760 hours. Recent Labs    03/26/22 1530  TSH 0.665    BMP Recent Labs    04/01/22 0512 04/02/22 0609 04/03/22 0445  NA 134* 137 134*  K 3.8 3.9 4.2  CL 104 103 102  CO2 _2 GLUCOSE 106* 94 98  BUN _3 CREATININE 1.07* 0.90 0.94  CALCIUM 9.0 9.4 8.7*  GFRNONAA 56* >60 >60    HEMOGLOBIN A1C No  results found for: "HGBA1C", "MPG"   External Labs: Collected: 06/18/2020 Creatinine 0.8 mg/dL. eGFR: >60 mL/min per 1.73 m Hemoglobin 12.3 g/dL, hematocrit 36.8% Lipid profile: Total cholesterol 290, triglycerides 43, HDL 135, LDL 146, non-HDL155 TSH: 3.09  IMPRESSION:    ICD-10-CM   1. Coronary atherosclerosis due to calcified coronary lesion  I25.10 EKG 12-Lead   I25.84     2. Benign hypertension  I10     3. Hypercholesterolemia  E78.00     4. Former smoker  Z87.891        RECOMMENDATIONS: LAURINE KUYPER is a 71 y.o. female whose past medical history and cardiac risk factors include: Hypertension, hypercholesterolemia, moderate coronary artery calcification, hypothyroidism, former smoker, advanced age, postmenopausal female.   Coronary atherosclerosis due to calcified coronary lesion Total calcium score of 152AU Given her coronary calcification, her estimated 10-year risk of ASCVD greater than 7.5% shared decision was start statin therapy (for secondary prevention). Continue aspirin and statin therapy. EKG nonischemic Denies angina pectoris.  Hypercholesterolemia Currently on atorvastatin. Will have annual labs in January 2024 with PCP-will send [place] a copy for reference. Does not endorse myalgias.  Benign hypertension Office blood pressures are within acceptable range. Currently takes atenolol and losartan in the morning and amlodipine at night. Medications reconciled Low-salt diet recommended Currently managed by primary care provider.  Former smoker Educated importance of complete smoking cessation.  FINAL MEDICATION LIST END OF ENCOUNTER: No orders of the defined types were placed in this encounter.    Current Outpatient Medications:    acetaminophen (TYLENOL) 650 MG CR tablet, Take 1,300 mg by mouth daily as needed for pain., Disp: , Rfl:    alendronate (FOSAMAX) 70 MG tablet, Take 70 mg by mouth once a week., Disp: , Rfl:    amLODipine (NORVASC) 5 MG  tablet, Take 5 mg by mouth daily., Disp: , Rfl:    aspirin  EC 81 MG tablet, Take 1 tablet (81 mg total) by mouth daily. Swallow whole., Disp: 90 tablet, Rfl: 3   atenolol (TENORMIN) 50 MG tablet, Take 1 tablet (50 mg total) by mouth daily., Disp: 30 tablet, Rfl: 3   atorvastatin (LIPITOR) 40 MG tablet, Take 40 mg by mouth daily., Disp: , Rfl:    CALCIUM-MAGNESIUM-ZINC PO, Take 2 tablets by mouth at bedtime., Disp: , Rfl:    Cholecalciferol (VITAMIN D-3 PO), Take 1 capsule by mouth daily., Disp: , Rfl:    cyanocobalamin (VITAMIN B12) 1000 MCG tablet, Take 1 tablet (1,000 mcg total) by mouth daily., Disp: , Rfl:    DULoxetine (CYMBALTA) 60 MG capsule, Take 60 mg by mouth daily., Disp: , Rfl: 4   folic acid (FOLVITE) 1 MG tablet, Take 1 tablet (1 mg total) by mouth daily., Disp: 90 tablet, Rfl: 3   lamoTRIgine (LAMICTAL) 150 MG tablet, Take 150 mg by mouth daily., Disp: , Rfl:    levothyroxine (SYNTHROID, LEVOTHROID) 100 MCG tablet, Take 50 mcg by mouth every morning. , Disp: , Rfl: 0   LORazepam (ATIVAN) 0.5 MG tablet, Take 1 tablet (0.5 mg total) by mouth 2 (two) times daily as needed for anxiety., Disp: 10 tablet, Rfl: 0   losartan (COZAAR) 25 MG tablet, Take 25 mg by mouth daily., Disp: , Rfl:    QUEtiapine (SEROQUEL) 400 MG tablet, Take 800 mg by mouth at bedtime., Disp: , Rfl:    thiamine (VITAMIN B1) 100 MG tablet, Take 1 tablet (100 mg total) by mouth daily., Disp: 90 tablet, Rfl: 3  Orders Placed This Encounter  Procedures   EKG 12-Lead    There are no Patient Instructions on file for this visit.  --Continue cardiac medications as reconciled in final medication list. --Return in about 1 year (around 06/26/2023) for Annual follow up visit, Coronary artery calcification. Or sooner if needed. --Continue follow-up with your primary care physician regarding the management of your other chronic comorbid conditions.  Patient's questions and concerns were addressed to her satisfaction. She  voices understanding of the instructions provided during this encounter.   This note was created using a voice recognition software as a result there may be grammatical errors inadvertently enclosed that do not reflect the nature of this encounter. Every attempt is made to correct such errors.  Rex Kras, [place], [facility]  Pager: [phone] Office: [phone]

## 2022-07-02 DIAGNOSIS — I10 Essential (primary) hypertension: Secondary | ICD-10-CM | POA: Diagnosis not present

## 2022-07-02 DIAGNOSIS — E039 Hypothyroidism, unspecified: Secondary | ICD-10-CM | POA: Diagnosis not present

## 2022-07-02 DIAGNOSIS — M810 Age-related osteoporosis without current pathological fracture: Secondary | ICD-10-CM | POA: Diagnosis not present

## 2022-07-07 DIAGNOSIS — E039 Hypothyroidism, unspecified: Secondary | ICD-10-CM | POA: Diagnosis not present

## 2022-07-07 DIAGNOSIS — F333 Major depressive disorder, recurrent, severe with psychotic symptoms: Secondary | ICD-10-CM | POA: Diagnosis not present

## 2022-07-07 DIAGNOSIS — I10 Essential (primary) hypertension: Secondary | ICD-10-CM | POA: Diagnosis not present

## 2022-07-07 DIAGNOSIS — I2510 Atherosclerotic heart disease of native coronary artery without angina pectoris: Secondary | ICD-10-CM | POA: Diagnosis not present

## 2022-07-07 DIAGNOSIS — R251 Tremor, unspecified: Secondary | ICD-10-CM | POA: Diagnosis not present

## 2022-07-07 DIAGNOSIS — Z0000 Encounter for general adult medical examination without abnormal findings: Secondary | ICD-10-CM | POA: Diagnosis not present

## 2022-07-07 DIAGNOSIS — J439 Emphysema, unspecified: Secondary | ICD-10-CM | POA: Diagnosis not present

## 2022-07-07 DIAGNOSIS — M810 Age-related osteoporosis without current pathological fracture: Secondary | ICD-10-CM | POA: Diagnosis not present

## 2022-07-07 DIAGNOSIS — E871 Hypo-osmolality and hyponatremia: Secondary | ICD-10-CM | POA: Diagnosis not present

## 2022-07-07 DIAGNOSIS — Z23 Encounter for immunization: Secondary | ICD-10-CM | POA: Diagnosis not present

## 2022-08-12 ENCOUNTER — Ambulatory Visit: Payer: Self-pay

## 2022-08-12 NOTE — Patient Outreach (Signed)
  Care Coordination   08/12/2022 Name: Veronica Wood MRN: [id number] DOB: 12/06/1950   Care Coordination Outreach Attempts:  An unsuccessful telephone outreach was attempted for a scheduled appointment today.  Follow Up Plan:  Additional outreach attempts will be made to offer the patient care coordination information and services.   Encounter Outcome:  No Answer   Care Coordination Interventions:  No, not indicated    Barb Merino, RN, BSN, CCM Care Management Coordinator [facility] Care Management Direct Phone: [phone]

## 2022-08-13 ENCOUNTER — Ambulatory Visit: Payer: Medicare Other

## 2022-08-14 NOTE — Patient Outreach (Signed)
  Care Coordination   Follow Up Visit Note   08/14/2022 Name: VALENE VILLA MRN: [id number] DOB: 04-Jun-1951  LEANNY MOECKEL is a 72 y.o. year old female who sees Deland Pretty, MD for primary care. I spoke with  Delphina Cahill by phone today.  What matters to the patients health and wellness today?  Patient will continue to monitor her BP at home and keep all scheduled MD appointments as directed.     Goals Addressed             This Visit's Progress    To continue to manage CAD       Care Coordination Interventions: Provided education on importance of blood pressure control in management of CAD Provided education on Importance of limiting foods high in cholesterol Counseled on the importance of exercise goals with target of 150 minutes per week Reviewed Importance of taking all medications as prescribed Reviewed Importance of attending all scheduled provider appointments           SDOH assessments and interventions completed:  No     Care Coordination Interventions:  Yes, provided   Follow up plan: Follow up call scheduled for 11/11/22 @09 :00 AM    Encounter Outcome:  Pt. Visit Completed

## 2022-08-14 NOTE — Patient Instructions (Signed)
Visit Information  Thank you for taking time to visit with me today. Please don't hesitate to contact me if I can be of assistance to you.   Following are the goals we discussed today:   Goals Addressed             This Visit's Progress    To continue to manage CAD       Care Coordination Interventions: Provided education on importance of blood pressure control in management of CAD Provided education on Importance of limiting foods high in cholesterol Counseled on the importance of exercise goals with target of 150 minutes per week Reviewed Importance of taking all medications as prescribed Reviewed Importance of attending all scheduled provider appointments           Our next appointment is by telephone on 11/11/22 at 09:00 AM  Please call the care guide team at [phone] if you need to cancel or reschedule your appointment.   If you are experiencing a Mental Health or [place] or need someone to talk to, please call 1-800-273-TALK (toll free, 24 hour hotline) go to [facility] [place] [phone])  The patient verbalized understanding of instructions, educational materials, and care plan provided today and DECLINED offer to receive copy of patient instructions, educational materials, and care plan.   Barb Merino, RN, BSN, CCM Care Management Coordinator [facility] Care Management Direct Phone: [phone]

## 2022-08-19 ENCOUNTER — Telehealth: Payer: Self-pay | Admitting: *Deleted

## 2022-08-19 NOTE — Progress Notes (Signed)
  Care Coordination Note  08/19/2022 Name: Veronica Wood MRN: [id number] DOB: Feb 11, 1951  Veronica Wood is a 72 y.o. year old female who is a primary care patient of Deland Pretty, MD and is actively engaged with the care management team. I reached out to Delphina Cahill by phone today to assist with scheduling a follow up visit with the RN Case Manager  Follow up plan: Unsuccessful telephone outreach attempt made. A HIPAA compliant phone message was left for the patient providing contact information and requesting a return call.   [place]  Direct Dial: [phone]

## 2022-08-19 NOTE — Progress Notes (Signed)
Outreaching

## 2022-08-26 ENCOUNTER — Telehealth: Payer: Self-pay | Admitting: *Deleted

## 2022-08-26 NOTE — Progress Notes (Signed)
  Care Coordination Note  08/26/2022 Name: GEORGEANNA NALLE MRN: [id number] DOB: 08/09/1950  MILLIANNA MCKERROW is a 72 y.o. year old female who is a primary care patient of Deland Pretty, MD and is actively engaged with the care management team. I reached out to Delphina Cahill by phone today to assist with re-scheduling a follow up visit with the RN Case Manager  Follow up plan: Unsuccessful telephone outreach attempt made. A HIPAA compliant phone message was left for the patient providing contact information and requesting a return call.  We have been unable to make contact with the patient for follow up.   [place]  Direct Dial: [phone]

## 2022-08-26 NOTE — Progress Notes (Signed)
  Care Coordination Note  08/26/2022 Name: Veronica Wood MRN: [id number] DOB: 1950/11/25  Veronica Wood is a 72 y.o. year old female who is a primary care patient of Deland Pretty, MD and is actively engaged with the care management team. I reached out to Delphina Cahill by phone today to assist with scheduling a follow up visit with the RN Case Manager  Follow up plan: Telephone appointment with care management team member scheduled for:12/01/22  [place]: [phone]

## 2022-09-03 DIAGNOSIS — H26492 Other secondary cataract, left eye: Secondary | ICD-10-CM | POA: Diagnosis not present

## 2022-09-03 DIAGNOSIS — H52203 Unspecified astigmatism, bilateral: Secondary | ICD-10-CM | POA: Diagnosis not present

## 2022-09-03 DIAGNOSIS — Z961 Presence of intraocular lens: Secondary | ICD-10-CM | POA: Diagnosis not present

## 2022-12-01 ENCOUNTER — Ambulatory Visit: Payer: Self-pay

## 2022-12-01 NOTE — Patient Instructions (Signed)
Visit Information  Thank you for taking time to visit with me today. Please don't hesitate to contact me if I can be of assistance to you.   Following are the goals we discussed today:   Goals Addressed             This Visit's Progress    To continue to manage CAD       Care Coordination Interventions: Provided education on importance of blood pressure control in management of CAD Provided education on Importance of limiting foods high in cholesterol Counseled on the importance of exercise goals with target of 150 minutes per week Reviewed Importance of taking all medications as prescribed Reviewed Importance of attending all scheduled provider appointments   Patient Goals/Self Care Activities: -Calls provider office for new concerns, questions, or BP outside discussed parameters -Checks BP and records as discussed -Follows a low sodium diet/DASH diet          Our next appointment is by telephone on 02/02/23 at 1100 am  Please call the care guide team at [phone] if you need to cancel or reschedule your appointment.   If you are experiencing a Mental Health or Behavioral Health Crisis or need someone to talk to, please call the Suicide and Crisis Lifeline: 988   The patient verbalized understanding of instructions, educational materials, and care plan provided today and agreed to receive a mailed copy of patient instructions, educational materials, and care plan.   The patient has been provided with contact information for the care management team and has been advised to call with any health related questions or concerns.   Bary Leriche, RN, MSN [facility] Care Management Care Management Coordinator Direct Line [phone]

## 2022-12-01 NOTE — Patient Outreach (Addendum)
  Care Coordination   Follow Up Visit Note   12/01/2022 Name: Veronica Wood MRN: [id number] DOB: 1951/01/19  Veronica Wood is a 72 y.o. year old female who sees Merri Brunette, MD for primary care. I spoke with  [name] by phone today.  What matters to the patients health and wellness today?  Health Maintenance. Patient reports doing good.  Blood pressure remains less than 140/80.  No concerns.      Goals Addressed             This Visit's Progress    To continue to manage CAD       Care Coordination Interventions: Provided education on importance of blood pressure control in management of CAD Provided education on Importance of limiting foods high in cholesterol Counseled on the importance of exercise goals with target of 150 minutes per week Reviewed Importance of taking all medications as prescribed Reviewed Importance of attending all scheduled provider appointments   Patient Goals/Self Care Activities: -Calls provider office for new concerns, questions, or BP outside discussed parameters -Checks BP and records as discussed -Follows a low sodium diet/DASH diet          SDOH assessments and interventions completed:  Yes  SDOH Interventions Today    Flowsheet Row Most Recent Value  SDOH Interventions   Food Insecurity Interventions Intervention Not Indicated  Housing Interventions Intervention Not Indicated        Care Coordination Interventions:  Yes, provided   Follow up plan: Follow up call scheduled for August    Encounter Outcome:  Pt. Visit Completed   Bary Leriche, RN, MSN [facility] Care Management Care Management Coordinator Direct Line [phone]

## 2023-02-02 ENCOUNTER — Ambulatory Visit: Payer: Self-pay

## 2023-02-02 DIAGNOSIS — Z01419 Encounter for gynecological examination (general) (routine) without abnormal findings: Secondary | ICD-10-CM | POA: Diagnosis not present

## 2023-02-02 DIAGNOSIS — Z1231 Encounter for screening mammogram for malignant neoplasm of breast: Secondary | ICD-10-CM | POA: Diagnosis not present

## 2023-02-02 DIAGNOSIS — Z6826 Body mass index (BMI) 26.0-26.9, adult: Secondary | ICD-10-CM | POA: Diagnosis not present

## 2023-02-02 DIAGNOSIS — N959 Unspecified menopausal and perimenopausal disorder: Secondary | ICD-10-CM | POA: Diagnosis not present

## 2023-02-02 NOTE — Patient Instructions (Signed)
Visit Information  Thank you for taking time to visit with me today. Please don't hesitate to contact me if I can be of assistance to you.   Following are the goals we discussed today:   Goals Addressed             This Visit's Progress    To continue to manage CAD       Care Coordination Interventions: Provided education on importance of blood pressure control in management of CAD Provided education on Importance of limiting foods high in cholesterol Counseled on the importance of exercise goals with target of 150 minutes per week Reviewed Importance of taking all medications as prescribed Reviewed Importance of attending all scheduled provider appointments   Patient Goals/Self Care Activities: -Calls provider office for new concerns, questions, or BP outside discussed parameters -Checks BP and records as discussed -Follows a low sodium diet/DASH diet   Patient doing ok.  Blood pressure control continues.  Discussed HTN Management and diet        Our next appointment is by telephone on 05/04/23 at 1100am  Please call the care guide team at [phone] if you need to cancel or reschedule your appointment.   If you are experiencing a Mental Health or Behavioral Health Crisis or need someone to talk to, please call the Suicide and Crisis Lifeline: 988   The patient verbalized understanding of instructions, educational materials, and care plan provided today and DECLINED offer to receive copy of patient instructions, educational materials, and care plan.   The patient has been provided with contact information for the care management team and has been advised to call with any health related questions or concerns.   Bary Leriche, RN, MSN [facility] Care Management Care Management Coordinator Direct Line [phone]

## 2023-02-02 NOTE — Patient Outreach (Signed)
  Care Coordination   Follow Up Visit Note   02/02/2023 Name: Veronica Wood MRN: [id number] DOB: Jul 14, 1950  Veronica Wood is a 72 y.o. year old female who sees Merri Brunette, MD for primary care. I spoke with  [name] by phone today.  What matters to the patients health and wellness today?  HTN management    Goals Addressed             This Visit's Progress    To continue to manage CAD       Care Coordination Interventions: Provided education on importance of blood pressure control in management of CAD Provided education on Importance of limiting foods high in cholesterol Counseled on the importance of exercise goals with target of 150 minutes per week Reviewed Importance of taking all medications as prescribed Reviewed Importance of attending all scheduled provider appointments   Patient Goals/Self Care Activities: -Calls provider office for new concerns, questions, or BP outside discussed parameters -Checks BP and records as discussed -Follows a low sodium diet/DASH diet   Patient doing ok.  Blood pressure control continues.  Discussed HTN Management and diet        SDOH assessments and interventions completed:  Yes     Care Coordination Interventions:  Yes, provided   Follow up plan: Follow up call scheduled for November    Encounter Outcome:  Pt. Visit Completed   Bary Leriche, RN, MSN [facility] Care Management Care Management Coordinator Direct Line [phone]

## 2023-03-05 DIAGNOSIS — Z23 Encounter for immunization: Secondary | ICD-10-CM | POA: Diagnosis not present

## 2023-04-23 ENCOUNTER — Other Ambulatory Visit: Payer: Self-pay | Admitting: Neurology

## 2023-05-04 ENCOUNTER — Ambulatory Visit: Payer: Self-pay

## 2023-05-04 NOTE — Patient Outreach (Signed)
  Care Coordination   05/04/2023 Name: Veronica Wood MRN: [id number] DOB: 1951-01-05   Care Coordination Outreach Attempts:  An unsuccessful telephone outreach was attempted for a scheduled appointment today.  Follow Up Plan:  Additional outreach attempts will be made to offer the patient care coordination information and services.   Encounter Outcome:  No Answer   Care Coordination Interventions:  No, not indicated    Bary Leriche, RN, MSN [facility]  [facility], [facility] Management Community Coordinator Direct Dial: [phone]  Fax: [phone] Website: [email or web address]

## 2023-05-19 ENCOUNTER — Telehealth: Payer: Self-pay

## 2023-05-19 NOTE — Patient Outreach (Signed)
  Care Coordination   05/19/2023 Name: TRANITA TERRY MRN: [id number] DOB: [birth year]   Care Coordination Outreach Attempts:  A second unsuccessful outreach was attempted today to offer the patient with information about available care coordination services.  Follow Up Plan:  Additional outreach attempts will be made to offer the patient care coordination information and services.   Encounter Outcome:  No Answer   Care Coordination Interventions:  No, not indicated    Bary Leriche, RN, MSN [facility]  [facility], [facility] Management Community Coordinator Direct Dial: [phone]  Fax: [phone] Website: [email or web address]

## 2023-05-27 ENCOUNTER — Telehealth: Payer: Self-pay

## 2023-05-27 NOTE — Patient Outreach (Signed)
  Care Coordination   05/27/2023 Name: Veronica Wood MRN: [id number] DOB: Nov 05, 1950   Care Coordination Outreach Attempts:  A third unsuccessful outreach was attempted today to offer the patient with information about available care coordination services.  Follow Up Plan:  No further outreach attempts will be made at this time. We have been unable to contact the patient to offer or enroll patient in care coordination services  Encounter Outcome:  No Answer   Care Coordination Interventions:  No, not indicated    Johnathen Testa Idelle Jo, RN, MSN RN Care Manager [facility], Population Health Direct Dial: [phone]  Fax: [phone] Website: [email or web address]

## 2023-07-07 DIAGNOSIS — I2510 Atherosclerotic heart disease of native coronary artery without angina pectoris: Secondary | ICD-10-CM | POA: Diagnosis not present

## 2023-07-07 DIAGNOSIS — E039 Hypothyroidism, unspecified: Secondary | ICD-10-CM | POA: Diagnosis not present

## 2023-07-07 DIAGNOSIS — M810 Age-related osteoporosis without current pathological fracture: Secondary | ICD-10-CM | POA: Diagnosis not present

## 2023-07-07 DIAGNOSIS — I10 Essential (primary) hypertension: Secondary | ICD-10-CM | POA: Diagnosis not present

## 2023-07-10 DIAGNOSIS — E871 Hypo-osmolality and hyponatremia: Secondary | ICD-10-CM | POA: Diagnosis not present

## 2023-07-10 DIAGNOSIS — E039 Hypothyroidism, unspecified: Secondary | ICD-10-CM | POA: Diagnosis not present

## 2023-07-10 DIAGNOSIS — I10 Essential (primary) hypertension: Secondary | ICD-10-CM | POA: Diagnosis not present

## 2023-07-10 DIAGNOSIS — Z0000 Encounter for general adult medical examination without abnormal findings: Secondary | ICD-10-CM | POA: Diagnosis not present

## 2023-07-10 DIAGNOSIS — Z23 Encounter for immunization: Secondary | ICD-10-CM | POA: Diagnosis not present

## 2023-07-10 DIAGNOSIS — L853 Xerosis cutis: Secondary | ICD-10-CM | POA: Diagnosis not present

## 2023-07-10 DIAGNOSIS — F333 Major depressive disorder, recurrent, severe with psychotic symptoms: Secondary | ICD-10-CM | POA: Diagnosis not present

## 2023-07-10 DIAGNOSIS — I2510 Atherosclerotic heart disease of native coronary artery without angina pectoris: Secondary | ICD-10-CM | POA: Diagnosis not present

## 2023-07-10 DIAGNOSIS — J439 Emphysema, unspecified: Secondary | ICD-10-CM | POA: Diagnosis not present

## 2023-07-10 DIAGNOSIS — Z8781 Personal history of (healed) traumatic fracture: Secondary | ICD-10-CM | POA: Diagnosis not present

## 2023-07-10 DIAGNOSIS — M8580 Other specified disorders of bone density and structure, unspecified site: Secondary | ICD-10-CM | POA: Diagnosis not present

## 2023-07-10 DIAGNOSIS — M810 Age-related osteoporosis without current pathological fracture: Secondary | ICD-10-CM | POA: Diagnosis not present

## 2023-07-22 DIAGNOSIS — H524 Presbyopia: Secondary | ICD-10-CM | POA: Diagnosis not present

## 2023-07-22 DIAGNOSIS — H02005 Unspecified entropion of left lower eyelid: Secondary | ICD-10-CM | POA: Diagnosis not present

## 2023-07-22 DIAGNOSIS — H02002 Unspecified entropion of right lower eyelid: Secondary | ICD-10-CM | POA: Diagnosis not present

## 2023-07-22 DIAGNOSIS — H26493 Other secondary cataract, bilateral: Secondary | ICD-10-CM | POA: Diagnosis not present

## 2023-07-22 DIAGNOSIS — Z961 Presence of intraocular lens: Secondary | ICD-10-CM | POA: Diagnosis not present

## 2023-07-24 ENCOUNTER — Ambulatory Visit: Payer: Medicare Other | Attending: Cardiology | Admitting: Cardiology

## 2023-07-24 ENCOUNTER — Encounter: Payer: Self-pay | Admitting: Cardiology

## 2023-07-24 VITALS — BP 138/80 | HR 77 | Resp 16 | Ht 66.0 in | Wt 167.0 lb

## 2023-07-24 DIAGNOSIS — E7800 Pure hypercholesterolemia, unspecified: Secondary | ICD-10-CM | POA: Diagnosis not present

## 2023-07-24 DIAGNOSIS — I2584 Coronary atherosclerosis due to calcified coronary lesion: Secondary | ICD-10-CM | POA: Insufficient documentation

## 2023-07-24 DIAGNOSIS — I2510 Atherosclerotic heart disease of native coronary artery without angina pectoris: Secondary | ICD-10-CM | POA: Insufficient documentation

## 2023-07-24 DIAGNOSIS — Z87891 Personal history of nicotine dependence: Secondary | ICD-10-CM | POA: Insufficient documentation

## 2023-07-24 DIAGNOSIS — I10 Essential (primary) hypertension: Secondary | ICD-10-CM | POA: Diagnosis not present

## 2023-07-24 NOTE — Patient Instructions (Signed)
Medication Instructions:  Your physician recommends that you continue on your current medications as directed. Please refer to the Current Medication list given to you today.  *If you need a refill on your cardiac medications before your next appointment, please call your pharmacy*  Lab Work: None ordered today.  Testing/Procedures: None ordered today.  Follow-Up: At [facility], you and your health needs are our priority.  As part of our continuing mission to provide you with exceptional heart care, we have created designated Provider Care Teams.  These Care Teams include your primary Cardiologist (physician) and Advanced Practice Providers (APPs -  Physician Assistants and Nurse Practitioners) who all work together to provide you with the care you need, when you need it.  We recommend signing up for the patient portal called "MyChart".  Sign up information is provided on this After Visit Summary.  MyChart is used to connect with patients for Virtual Visits (Telemedicine).  Patients are able to view lab/test results, encounter notes, upcoming appointments, etc.  Non-urgent messages can be sent to your provider as well.   To learn more about what you can do with MyChart, go to [email or web address].    Your next appointment:   As needed  The format for your next appointment:   In Person  Provider:   Tessa Lerner, DO {

## 2023-07-24 NOTE — Progress Notes (Signed)
Cardiology Office Note:  .   Date:  07/24/2023  ID:  SABA GOMM, DOB [birth year], MRN [id number] PCP:  Merri Brunette, MD  Former Cardiology Providers: None  [facility] Providers Cardiologist:  Tessa Lerner, DO , [facility] (established care 07/27/2020) Electrophysiologist:  None  Click to update primary MD,subspecialty MD or APP then REFRESH:1}    Chief Complaint  Patient presents with   Coronary atherosclerosis due to calcified coronary lesion   Follow-up    History of Present Illness: .   Veronica Wood is a 73 y.o. Caucasian female whose past medical history and cardiovascular risk factors includes: Hypertension, hypercholesterolemia, moderate coronary artery calcification, hypothyroidism, former smoker, advanced age, postmenopausal female.   Patient underwent coronary calcium score with PCP was noted to have moderate CAC and was referred to cardiology.  She underwent appropriate ischemic workup as outlined below.  She presents today for 1 year follow-up visit.  Over the last 1 year she denies any anginal chest pain or heart failure symptoms.  Overall functional capacity also remains relatively.  She recently had labs with PCP which were independently reviewed and noted below for reference.  Review of Systems: .   Review of Systems  Cardiovascular:  Negative for chest pain, claudication, irregular heartbeat, leg swelling, near-syncope, orthopnea, palpitations, paroxysmal nocturnal dyspnea and syncope.  Respiratory:  Negative for shortness of breath.   Hematologic/Lymphatic: Negative for bleeding problem.   Studies Reviewed:   EKG: EKG Interpretation Date/Time:  Friday July 24 2023 10:08:49 EST Ventricular Rate:  74 PR Interval:  166 QRS Duration:  88 QT Interval:  398 QTC Calculation: 441 R Axis:   -3  Text Interpretation: Normal sinus rhythm Septal infarct , age undetermined No previous ECGs available Confirmed by Tessa Lerner [id number]) on 07/24/2023 10:18:13  AM  Echocardiogram: 08/02/2020:  Normal LV systolic function with visual EF 55-60%. Left ventricle cavity  is normal in size. Normal global wall motion. Normal diastolic filling  pattern, normal LAP.  Mild (Grade I) mitral regurgitation.  No other significant valvular abnormalities.  No prior study for comparison.   Stress Testing: Lexiscan/modified Bruce Tetrofosmin stress test 08/06/2020: Low risk study.  See report for additional details  Coronary artery calcification scoring performed on 07/17/2020 at [facility]: Left main: 0 Left anterior descending:43 Left circumflex:103 Right coronary artery:0 Total calcium score: 152 AU  RADIOLOGY: NA  Risk Assessment/Calculations:   NA   Labs:    External Labs: Collected: 07/07/2023 Total cholesterol 217, triglycerides 66, HDL 139, LDL 66  Physical Exam:    Today's Vitals   07/24/23 1004  BP: 138/80  Pulse: 77  Resp: 16  SpO2: 95%  Weight: 167 lb (75.8 kg)  Height: 5\' 6"  (1.676 m)   Body mass index is 26.95 kg/m. Wt Readings from Last 3 Encounters:  07/24/23 167 lb (75.8 kg)  06/25/22 157 lb 12.8 oz (71.6 kg)  05/06/22 160 lb (72.6 kg)    Physical Exam  Constitutional: No distress.  Age appropriate, hemodynamically stable.   Neck: No JVD present.  Cardiovascular: Normal rate, regular rhythm, S1 normal, S2 normal, intact distal pulses and normal pulses. Exam reveals no gallop, no S3 and no S4.  No murmur heard. Pulses:      Dorsalis pedis pulses are 2+ on the right side and 2+ on the left side.       Posterior tibial pulses are 2+ on the right side and 2+ on the left side.  Pulmonary/Chest: Effort normal and breath sounds  normal. No stridor. She has no wheezes. She has no rales.  Abdominal: Soft. Bowel sounds are normal. She exhibits no distension. There is no abdominal tenderness.  Musculoskeletal:        General: No edema.     Cervical back: Neck supple.  Neurological: She is alert and oriented to person,  place, and time. She has intact cranial nerves (2-12).  Skin: Skin is warm and moist.     Impression & Recommendation(s):  Impression:   ICD-10-CM   1. Coronary atherosclerosis due to calcified coronary lesion  I25.10 EKG 12-Lead   I25.84     2. Benign hypertension  I10     3. Hypercholesterolemia  E78.00     4. Former smoker  Z87.891        Recommendation(s):  Coronary atherosclerosis due to calcified coronary lesion Total calcium score of 152AU Continue aspirin and statin therapy. EKG is nonischemic. No anginal chest pain or heart failure symptoms at the last 1 year. Continues to follow with PCP regularly to help monitor/manage her risk factor for secondary prevention. Prior echo and stress test results reviewed as part of medical decision making during today's office visit  Benign hypertension Office blood pressures are acceptable. Continue amlodipine 5 mg p.o. daily. Continue atenolol 50 mg p.o. daily. Continue losartan 25 mg p.o. daily Reemphasized importance of low-salt diet.  Hypercholesterolemia Currently on atorvastatin 40 mg p.o. daily.   She denies myalgia or other side effects. Most recent lipids dated January 2025, independently reviewed as noted above.  LDL is 66 mg/dL.  Cardiology is following peripherally.  I spent 35 minutes in the care of Veronica Wood today including reviewing outside labs from [facility] database (dating 07/07/2023), reviewing studies (echo from February 2022, stress test from 2022), and documenting in the encounter.  Orders Placed:  Orders Placed This Encounter  Procedures   EKG 12-Lead   Final Medication List:   No orders of the defined types were placed in this encounter.   There are no discontinued medications.   Current Outpatient Medications:    acetaminophen (TYLENOL) 650 MG CR tablet, Take 1,300 mg by mouth daily as needed for pain., Disp: , Rfl:    alendronate (FOSAMAX) 70 MG tablet, Take 70 mg by mouth once a week., Disp:  , Rfl:    amLODipine (NORVASC) 5 MG tablet, Take 5 mg by mouth daily., Disp: , Rfl:    aspirin EC 81 MG tablet, Take 1 tablet (81 mg total) by mouth daily. Swallow whole., Disp: 90 tablet, Rfl: 3   atenolol (TENORMIN) 50 MG tablet, Take 1 tablet (50 mg total) by mouth daily., Disp: 30 tablet, Rfl: 3   atorvastatin (LIPITOR) 40 MG tablet, Take 40 mg by mouth daily., Disp: , Rfl:    CALCIUM-MAGNESIUM-ZINC PO, Take 2 tablets by mouth at bedtime., Disp: , Rfl:    Cholecalciferol (VITAMIN D-3 PO), Take 1 capsule by mouth daily., Disp: , Rfl:    Cholecalciferol (VITAMIN D3) 25 MCG (1000 UT) CAPS, , Disp: , Rfl:    cyanocobalamin (VITAMIN B12) 1000 MCG tablet, Take 1 tablet (1,000 mcg total) by mouth daily., Disp: , Rfl:    DULoxetine (CYMBALTA) 60 MG capsule, Take 60 mg by mouth daily., Disp: , Rfl: 4   lamoTRIgine (LAMICTAL) 150 MG tablet, Take 150 mg by mouth daily., Disp: , Rfl:    levothyroxine (SYNTHROID, LEVOTHROID) 100 MCG tablet, Take 50 mcg by mouth every morning. , Disp: , Rfl: 0   LORazepam (ATIVAN) 0.5  MG tablet, Take 1 tablet (0.5 mg total) by mouth 2 (two) times daily as needed for anxiety., Disp: 10 tablet, Rfl: 0   losartan (COZAAR) 25 MG tablet, Take 25 mg by mouth daily., Disp: , Rfl:    Multiple Vitamin (MULTIVITAMIN) tablet, Take 1 tablet by mouth daily., Disp: , Rfl:    QUEtiapine (SEROQUEL) 400 MG tablet, Take 800 mg by mouth at bedtime., Disp: , Rfl:   Consent:   N/A  Disposition:   Patient has done well over the last 2 years and currently does not have any cardiovascular complaints/discomfort.  We discussed following up on an annual basis or as needed.  Shared decision was to follow-up on needed basis.  She is more than welcome to come see me sooner if change in clinical status.  Patient may be asked to follow-up sooner based on the results of the above-mentioned testing.  Her questions and concerns were addressed to her satisfaction. She voices understanding of the  recommendations provided during this encounter.    Signed, Tessa Lerner, DO, [facility]  [facility]  [address] [place], [place] [address] 07/24/2023 10:47 AM

## 2023-07-27 DIAGNOSIS — Z860101 Personal history of adenomatous and serrated colon polyps: Secondary | ICD-10-CM | POA: Diagnosis not present

## 2023-07-27 DIAGNOSIS — K5900 Constipation, unspecified: Secondary | ICD-10-CM | POA: Diagnosis not present

## 2023-08-13 DIAGNOSIS — H02002 Unspecified entropion of right lower eyelid: Secondary | ICD-10-CM | POA: Diagnosis not present

## 2023-08-13 DIAGNOSIS — H02005 Unspecified entropion of left lower eyelid: Secondary | ICD-10-CM | POA: Diagnosis not present

## 2023-08-26 DIAGNOSIS — D123 Benign neoplasm of transverse colon: Secondary | ICD-10-CM | POA: Diagnosis not present

## 2023-08-26 DIAGNOSIS — K621 Rectal polyp: Secondary | ICD-10-CM | POA: Diagnosis not present

## 2023-08-26 DIAGNOSIS — Z860100 Personal history of colon polyps, unspecified: Secondary | ICD-10-CM | POA: Diagnosis not present

## 2023-08-26 DIAGNOSIS — Z09 Encounter for follow-up examination after completed treatment for conditions other than malignant neoplasm: Secondary | ICD-10-CM | POA: Diagnosis not present

## 2023-08-26 DIAGNOSIS — D128 Benign neoplasm of rectum: Secondary | ICD-10-CM | POA: Diagnosis not present

## 2023-09-08 DIAGNOSIS — M4854XA Collapsed vertebra, not elsewhere classified, thoracic region, initial encounter for fracture: Secondary | ICD-10-CM | POA: Diagnosis not present

## 2023-09-14 DIAGNOSIS — M4854XA Collapsed vertebra, not elsewhere classified, thoracic region, initial encounter for fracture: Secondary | ICD-10-CM | POA: Diagnosis not present

## 2023-09-14 DIAGNOSIS — M545 Low back pain, unspecified: Secondary | ICD-10-CM | POA: Diagnosis not present

## 2023-09-16 DIAGNOSIS — R600 Localized edema: Secondary | ICD-10-CM | POA: Diagnosis not present

## 2023-09-16 DIAGNOSIS — M810 Age-related osteoporosis without current pathological fracture: Secondary | ICD-10-CM | POA: Diagnosis not present

## 2023-09-16 DIAGNOSIS — M8000XA Age-related osteoporosis with current pathological fracture, unspecified site, initial encounter for fracture: Secondary | ICD-10-CM | POA: Diagnosis not present

## 2023-09-22 ENCOUNTER — Other Ambulatory Visit: Payer: Self-pay

## 2023-09-22 ENCOUNTER — Emergency Department (HOSPITAL_COMMUNITY)
Admission: EM | Admit: 2023-09-22 | Discharge: 2023-09-22 | Disposition: A | Discharge status: Home / Self Care | Attending: Emergency Medicine | Admitting: Emergency Medicine

## 2023-09-22 ENCOUNTER — Encounter (HOSPITAL_COMMUNITY): Payer: Self-pay

## 2023-09-22 ENCOUNTER — Emergency Department (HOSPITAL_COMMUNITY)

## 2023-09-22 DIAGNOSIS — S2231XA Fracture of one rib, right side, initial encounter for closed fracture: Secondary | ICD-10-CM | POA: Insufficient documentation

## 2023-09-22 DIAGNOSIS — S299XXA Unspecified injury of thorax, initial encounter: Secondary | ICD-10-CM | POA: Diagnosis present

## 2023-09-22 DIAGNOSIS — Z7982 Long term (current) use of aspirin: Secondary | ICD-10-CM | POA: Diagnosis not present

## 2023-09-22 DIAGNOSIS — W010XXA Fall on same level from slipping, tripping and stumbling without subsequent striking against object, initial encounter: Secondary | ICD-10-CM | POA: Insufficient documentation

## 2023-09-22 DIAGNOSIS — R1011 Right upper quadrant pain: Secondary | ICD-10-CM | POA: Diagnosis not present

## 2023-09-22 DIAGNOSIS — I10 Essential (primary) hypertension: Secondary | ICD-10-CM | POA: Diagnosis not present

## 2023-09-22 DIAGNOSIS — R0781 Pleurodynia: Secondary | ICD-10-CM | POA: Diagnosis not present

## 2023-09-22 MED ORDER — LIDOCAINE 5 % EX PTCH
1.0000 | MEDICATED_PATCH | CUTANEOUS | Status: DC
  Administered 2023-09-22: 1 via TRANSDERMAL
  Filled 2023-09-22: qty 1

## 2023-09-22 MED ORDER — OXYCODONE HCL 5 MG PO TABS: 2.5000 mg | ORAL_TABLET | ORAL | 0 refills | Status: AC | PRN

## 2023-09-22 MED ORDER — OXYCODONE-ACETAMINOPHEN 5-325 MG PO TABS
2.0000 | ORAL_TABLET | Freq: Once | ORAL | Status: AC
  Administered 2023-09-22: 2 via ORAL
  Filled 2023-09-22: qty 2

## 2023-09-22 NOTE — ED Triage Notes (Signed)
 Patient brought in by EMS for rib pain. Reports earlier this morning she fell into her fence and landed on her right side of ribs area. Reports pain with breathing and less pain when holding the area.

## 2023-09-22 NOTE — Discharge Instructions (Signed)
 General instructions Do deep breathing exercises as told by your health care provider. This helps prevent pneumonia, which is a common complication of a broken rib. Your health care provider may instruct you to: Take deep breaths several times a day. Try to cough several times a day, holding a pillow against the injured area. Use a device called incentive spirometer to practice deep breathing several times a day. Drink enough fluid to keep your urine pale yellow. Do not wear a rib belt or binder. These restrict breathing, which can lead to pneumonia. Keep all follow-up visits. This is important. Contact a health care provider if: You have a fever. Get help right away if: You have difficulty breathing or you are short of breath. You develop a cough that does not stop, or you cough up thick or bloody sputum. You have nausea, vomiting, or pain in your abdomen. Your pain gets worse and medicine does not help. These symptoms may represent a serious problem that is an emergency. Do not wait to see if the symptoms will go away. Get medical help right away. Call your local emergency services (911 in the [place] Do not drive yourself to the hospital.

## 2023-09-22 NOTE — ED Provider Notes (Signed)
 [place] EMERGENCY DEPARTMENT AT [facility] Provider Note   CSN: [id number] Arrival date & time: 09/22/23  1722     History  Chief Complaint  Patient presents with   Rib Injury    Veronica Wood is a 73 y.o. female who presents emergency department with a chief complaint of rib pain.  Patient had a mechanical fall tripping and smashing her right rib cage against the side of a chain-link fence.  Since that time she has had severe "12 out of 10" pain in the right rib with intermittent spasms and pain with deep breathing.  She has relief when she holds pressure against the right side of her chest wall.  She denies hemoptysis or shortness of breath.  HPI     Home Medications Prior to Admission medications   Medication Sig Start Date End Date Taking? Authorizing Provider  acetaminophen (TYLENOL) 650 MG CR tablet Take 1,300 mg by mouth daily as needed for pain.    [provider]  alendronate (FOSAMAX) 70 MG tablet Take 70 mg by mouth once a week.    [provider]  amLODipine (NORVASC) 5 MG tablet Take 5 mg by mouth daily. 05/26/20   [provider]  aspirin EC 81 MG tablet Take 1 tablet (81 mg total) by mouth daily. Swallow whole. 07/27/20   Tolia, Sunit, DO  atenolol (TENORMIN) 50 MG tablet Take 1 tablet (50 mg total) by mouth daily. 03/27/22   Lewie Chamber, MD  atorvastatin (LIPITOR) 40 MG tablet Take 40 mg by mouth daily.    [provider]  CALCIUM-MAGNESIUM-ZINC PO Take 2 tablets by mouth at bedtime.    [provider]  Cholecalciferol (VITAMIN D-3 PO) Take 1 capsule by mouth daily.    [provider]  Cholecalciferol (VITAMIN D3) 25 MCG (1000 UT) CAPS     [provider]  cyanocobalamin (VITAMIN B12) 1000 MCG tablet Take 1 tablet (1,000 mcg total) by mouth daily. 03/27/22   Lewie Chamber, MD  DULoxetine (CYMBALTA) 60 MG capsule Take 60 mg by mouth daily. 01/21/16   [provider]  lamoTRIgine  (LAMICTAL) 150 MG tablet Take 150 mg by mouth daily.    [provider]  levothyroxine (SYNTHROID, LEVOTHROID) 100 MCG tablet Take 50 mcg by mouth every morning.  02/20/16   [provider]  LORazepam (ATIVAN) 0.5 MG tablet Take 1 tablet (0.5 mg total) by mouth 2 (two) times daily as needed for anxiety. 04/03/22   Glade Lloyd, MD  losartan (COZAAR) 25 MG tablet Take 25 mg by mouth daily. 08/20/20   [provider]  Multiple Vitamin (MULTIVITAMIN) tablet Take 1 tablet by mouth daily.    [provider]  QUEtiapine (SEROQUEL) 400 MG tablet Take 800 mg by mouth at bedtime.    [provider]      Allergies    Celexa [citalopram hydrobromide]    Review of Systems   Review of Systems  Physical Exam Updated Vital Signs BP (!) 153/92   Pulse 76   Temp 98.5 F (36.9 C) (Oral)   Resp 18   Ht 5\' 6"  (1.676 m)   Wt 75.8 kg   SpO2 97%   BMI 26.97 kg/m  Physical Exam Vitals and nursing note reviewed.  Constitutional:      General: She is not in acute distress.    Appearance: She is well-developed. She is not diaphoretic.     Comments: Appears uncomfortable  HENT:  Head: Normocephalic and atraumatic.     Right Ear: External ear normal.     Left Ear: External ear normal.     Nose: Nose normal.     Mouth/Throat:     Mouth: Mucous membranes are moist.  Eyes:     General: No scleral icterus.    Conjunctiva/sclera: Conjunctivae normal.  Cardiovascular:     Rate and Rhythm: Normal rate and regular rhythm.     Heart sounds: Normal heart sounds. No murmur heard.    No friction rub. No gallop.  Pulmonary:     Effort: Pulmonary effort is normal. No respiratory distress.     Breath sounds: Normal breath sounds.  Chest:       Comments: Patient with exquisite tenderness in the area denoted on the right lateral rib cage, no step-off, crepitus. Abdominal:     General: Bowel sounds are normal. There is no distension.     Palpations: Abdomen is  soft. There is no mass.     Tenderness: There is no abdominal tenderness. There is no guarding.  Musculoskeletal:     Cervical back: Normal range of motion.  Skin:    General: Skin is warm and dry.  Neurological:     Mental Status: She is alert and oriented to person, place, and time.  Psychiatric:        Behavior: Behavior normal.     ED Results / Procedures / Treatments   Labs (all labs ordered are listed, but only abnormal results are displayed) Labs Reviewed - No data to display  EKG None  Radiology DG Ribs Unilateral W/Chest Right Result Date: 09/22/2023 CLINICAL DATA:  Fall. EXAM: RIGHT RIBS AND CHEST - 3+ VIEW COMPARISON:  03/26/2022. FINDINGS: Bilateral lung fields are clear. Bilateral costophrenic angles are clear. Normal cardio-mediastinal silhouette. No acute osseous abnormalities. There is mildly displaced fracture of the lateral aspect of the right fourth rib. However, the fracture margins are not sharp and there is probable callus formation, favoring subacute fracture. Correlate clinically. Otherwise no acute displaced rib fracture.  No focal rib lesion. The soft tissues are within normal limits. IMPRESSION: 1. No active cardiopulmonary disease. 2. Mildly displaced fracture of the lateral aspect of the right fourth rib, as described above. Electronically Signed   By: Jules Schick M.D.   On: 09/22/2023 18:12    Procedures Procedures    Medications Ordered in ED Medications  lidocaine (LIDODERM) 5 % 1 patch (has no administration in time range)  oxyCODONE-acetaminophen (PERCOCET/ROXICET) 5-325 MG per tablet 2 tablet (has no administration in time range)    ED Course/ Medical Decision Making/ A&P                                 Medical Decision Making Amount and/or Complexity of Data Reviewed Radiology: ordered.  Risk Prescription drug management.  I visualized and interpreted right rib view x-ray.  Patient's x-ray shows a displaced fourth rib fracture.  No  evidence of pneumothorax. Patient given lidocaine patch, pain medications.  Will give definitive fracture care with incentive spirometer.  Follow closely with PCP for continued pain needs.  Discussed expected course of healing time, supportive care and return precautions.        Final Clinical Impression(s) / ED Diagnoses Final diagnoses:  Closed fracture of one rib of right side, initial encounter    Rx / DC Orders ED Discharge Orders     None  Arthor Captain, PA-C 09/22/23 1833    Vanetta Mulders, MD 09/22/23 2139

## 2023-09-30 DIAGNOSIS — M546 Pain in thoracic spine: Secondary | ICD-10-CM | POA: Diagnosis not present

## 2023-10-06 DIAGNOSIS — M4854XA Collapsed vertebra, not elsewhere classified, thoracic region, initial encounter for fracture: Secondary | ICD-10-CM | POA: Diagnosis not present

## 2023-10-13 DIAGNOSIS — E039 Hypothyroidism, unspecified: Secondary | ICD-10-CM | POA: Diagnosis not present

## 2023-10-13 DIAGNOSIS — E871 Hypo-osmolality and hyponatremia: Secondary | ICD-10-CM | POA: Diagnosis not present

## 2023-10-13 DIAGNOSIS — R413 Other amnesia: Secondary | ICD-10-CM | POA: Diagnosis not present

## 2023-10-13 DIAGNOSIS — F333 Major depressive disorder, recurrent, severe with psychotic symptoms: Secondary | ICD-10-CM | POA: Diagnosis not present

## 2023-10-13 DIAGNOSIS — I10 Essential (primary) hypertension: Secondary | ICD-10-CM | POA: Diagnosis not present

## 2023-10-21 DIAGNOSIS — R413 Other amnesia: Secondary | ICD-10-CM | POA: Diagnosis not present

## 2023-10-26 DIAGNOSIS — R413 Other amnesia: Secondary | ICD-10-CM | POA: Diagnosis not present

## 2023-11-11 DIAGNOSIS — G309 Alzheimer's disease, unspecified: Secondary | ICD-10-CM | POA: Diagnosis not present

## 2023-11-11 DIAGNOSIS — I10 Essential (primary) hypertension: Secondary | ICD-10-CM | POA: Diagnosis not present

## 2024-01-26 DIAGNOSIS — R413 Other amnesia: Secondary | ICD-10-CM | POA: Diagnosis not present

## 2024-03-24 DIAGNOSIS — M5459 Other low back pain: Secondary | ICD-10-CM | POA: Diagnosis not present

## 2024-03-24 DIAGNOSIS — M546 Pain in thoracic spine: Secondary | ICD-10-CM | POA: Diagnosis not present

## 2024-03-24 DIAGNOSIS — M545 Low back pain, unspecified: Secondary | ICD-10-CM | POA: Diagnosis not present

## 2024-03-24 DIAGNOSIS — M542 Cervicalgia: Secondary | ICD-10-CM | POA: Diagnosis not present

## 2024-04-05 DIAGNOSIS — M545 Low back pain, unspecified: Secondary | ICD-10-CM | POA: Diagnosis not present

## 2024-04-05 DIAGNOSIS — M5459 Other low back pain: Secondary | ICD-10-CM | POA: Diagnosis not present

## 2024-04-05 DIAGNOSIS — M542 Cervicalgia: Secondary | ICD-10-CM | POA: Diagnosis not present

## 2024-04-05 DIAGNOSIS — M546 Pain in thoracic spine: Secondary | ICD-10-CM | POA: Diagnosis not present

## 2024-04-07 DIAGNOSIS — M542 Cervicalgia: Secondary | ICD-10-CM | POA: Diagnosis not present

## 2024-04-07 DIAGNOSIS — M5459 Other low back pain: Secondary | ICD-10-CM | POA: Diagnosis not present

## 2024-04-07 DIAGNOSIS — M545 Low back pain, unspecified: Secondary | ICD-10-CM | POA: Diagnosis not present

## 2024-04-07 DIAGNOSIS — M546 Pain in thoracic spine: Secondary | ICD-10-CM | POA: Diagnosis not present

## 2024-04-12 DIAGNOSIS — M542 Cervicalgia: Secondary | ICD-10-CM | POA: Diagnosis not present

## 2024-04-12 DIAGNOSIS — M546 Pain in thoracic spine: Secondary | ICD-10-CM | POA: Diagnosis not present

## 2024-04-12 DIAGNOSIS — M545 Low back pain, unspecified: Secondary | ICD-10-CM | POA: Diagnosis not present

## 2024-04-12 DIAGNOSIS — M5459 Other low back pain: Secondary | ICD-10-CM | POA: Diagnosis not present

## 2024-05-10 DIAGNOSIS — M546 Pain in thoracic spine: Secondary | ICD-10-CM | POA: Diagnosis not present

## 2024-05-10 DIAGNOSIS — M542 Cervicalgia: Secondary | ICD-10-CM | POA: Diagnosis not present

## 2024-05-10 DIAGNOSIS — M545 Low back pain, unspecified: Secondary | ICD-10-CM | POA: Diagnosis not present

## 2024-05-10 DIAGNOSIS — M5459 Other low back pain: Secondary | ICD-10-CM | POA: Diagnosis not present

## 2024-05-12 DIAGNOSIS — M545 Low back pain, unspecified: Secondary | ICD-10-CM | POA: Diagnosis not present

## 2024-05-12 DIAGNOSIS — M5459 Other low back pain: Secondary | ICD-10-CM | POA: Diagnosis not present

## 2024-05-12 DIAGNOSIS — M546 Pain in thoracic spine: Secondary | ICD-10-CM | POA: Diagnosis not present

## 2024-05-12 DIAGNOSIS — M542 Cervicalgia: Secondary | ICD-10-CM | POA: Diagnosis not present

## 2024-05-19 DIAGNOSIS — M545 Low back pain, unspecified: Secondary | ICD-10-CM | POA: Diagnosis not present

## 2024-05-19 DIAGNOSIS — M542 Cervicalgia: Secondary | ICD-10-CM | POA: Diagnosis not present

## 2024-05-19 DIAGNOSIS — M546 Pain in thoracic spine: Secondary | ICD-10-CM | POA: Diagnosis not present

## 2024-05-19 DIAGNOSIS — M5459 Other low back pain: Secondary | ICD-10-CM | POA: Diagnosis not present

## 2024-05-24 DIAGNOSIS — M542 Cervicalgia: Secondary | ICD-10-CM | POA: Diagnosis not present

## 2024-05-24 DIAGNOSIS — M545 Low back pain, unspecified: Secondary | ICD-10-CM | POA: Diagnosis not present

## 2024-05-24 DIAGNOSIS — M5459 Other low back pain: Secondary | ICD-10-CM | POA: Diagnosis not present

## 2024-05-24 DIAGNOSIS — M546 Pain in thoracic spine: Secondary | ICD-10-CM | POA: Diagnosis not present

## 2024-05-31 DIAGNOSIS — M542 Cervicalgia: Secondary | ICD-10-CM | POA: Diagnosis not present

## 2024-05-31 DIAGNOSIS — M5459 Other low back pain: Secondary | ICD-10-CM | POA: Diagnosis not present

## 2024-05-31 DIAGNOSIS — M546 Pain in thoracic spine: Secondary | ICD-10-CM | POA: Diagnosis not present

## 2024-05-31 DIAGNOSIS — M545 Low back pain, unspecified: Secondary | ICD-10-CM | POA: Diagnosis not present

## 2024-06-07 DIAGNOSIS — M542 Cervicalgia: Secondary | ICD-10-CM | POA: Diagnosis not present

## 2024-06-07 DIAGNOSIS — M546 Pain in thoracic spine: Secondary | ICD-10-CM | POA: Diagnosis not present

## 2024-06-07 DIAGNOSIS — M5459 Other low back pain: Secondary | ICD-10-CM | POA: Diagnosis not present

## 2024-06-07 DIAGNOSIS — M545 Low back pain, unspecified: Secondary | ICD-10-CM | POA: Diagnosis not present

## 2024-06-14 DIAGNOSIS — M5459 Other low back pain: Secondary | ICD-10-CM | POA: Diagnosis not present

## 2024-06-14 DIAGNOSIS — M545 Low back pain, unspecified: Secondary | ICD-10-CM | POA: Diagnosis not present

## 2024-06-14 DIAGNOSIS — M546 Pain in thoracic spine: Secondary | ICD-10-CM | POA: Diagnosis not present

## 2024-06-14 DIAGNOSIS — M542 Cervicalgia: Secondary | ICD-10-CM | POA: Diagnosis not present

## 2024-07-07 LAB — LAB REPORT - SCANNED
Albumin, Urine POC: <3
Creatinine, POC: 21.9 mg/dL
EGFR: 66
Microalb Creat Ratio: <14
